# Patient Record
Sex: Female | Born: 1960 | Race: White | Hispanic: No | Smoking: Never smoker
Health system: Southern US, Community
[De-identification: ages and names within clinical notes are randomized; demographics above are authoritative.]

## PROBLEM LIST (undated history)

## (undated) DIAGNOSIS — Z8619 Personal history of other infectious and parasitic diseases: Secondary | ICD-10-CM

## (undated) DIAGNOSIS — T7840XA Allergy, unspecified, initial encounter: Secondary | ICD-10-CM

## (undated) DIAGNOSIS — E785 Hyperlipidemia, unspecified: Secondary | ICD-10-CM

## (undated) DIAGNOSIS — Z860100 Personal history of colon polyps, unspecified: Secondary | ICD-10-CM

## (undated) DIAGNOSIS — F329 Major depressive disorder, single episode, unspecified: Secondary | ICD-10-CM

## (undated) DIAGNOSIS — Z86718 Personal history of other venous thrombosis and embolism: Secondary | ICD-10-CM

## (undated) DIAGNOSIS — I499 Cardiac arrhythmia, unspecified: Secondary | ICD-10-CM

## (undated) DIAGNOSIS — C4491 Basal cell carcinoma of skin, unspecified: Secondary | ICD-10-CM

## (undated) DIAGNOSIS — D689 Coagulation defect, unspecified: Secondary | ICD-10-CM

## (undated) DIAGNOSIS — I10 Essential (primary) hypertension: Secondary | ICD-10-CM

## (undated) DIAGNOSIS — M8580 Other specified disorders of bone density and structure, unspecified site: Secondary | ICD-10-CM

## (undated) DIAGNOSIS — F32A Depression, unspecified: Secondary | ICD-10-CM

## (undated) DIAGNOSIS — G43909 Migraine, unspecified, not intractable, without status migrainosus: Secondary | ICD-10-CM

## (undated) DIAGNOSIS — I252 Old myocardial infarction: Secondary | ICD-10-CM

## (undated) DIAGNOSIS — M1990 Unspecified osteoarthritis, unspecified site: Secondary | ICD-10-CM

## (undated) HISTORY — DX: Allergy, unspecified, initial encounter: T78.40XA

## (undated) HISTORY — DX: Other specified disorders of bone density and structure, unspecified site: M85.80

## (undated) HISTORY — DX: Personal history of colon polyps, unspecified: Z86.0100

## (undated) HISTORY — DX: Hyperlipidemia, unspecified: E78.5

## (undated) HISTORY — DX: Personal history of colonic polyps: Z86.010

## (undated) HISTORY — DX: Essential (primary) hypertension: I10

## (undated) HISTORY — DX: Personal history of other venous thrombosis and embolism: Z86.718

## (undated) HISTORY — DX: Coagulation defect, unspecified: D68.9

## (undated) HISTORY — DX: Unspecified osteoarthritis, unspecified site: M19.90

## (undated) HISTORY — DX: Personal history of other infectious and parasitic diseases: Z86.19

## (undated) HISTORY — PX: DILATION AND CURETTAGE OF UTERUS: SHX78

## (undated) HISTORY — PX: BUNIONECTOMY: SHX129

## (undated) HISTORY — DX: Depression, unspecified: F32.A

## (undated) HISTORY — DX: Basal cell carcinoma of skin, unspecified: C44.91

## (undated) HISTORY — DX: Old myocardial infarction: I25.2

## (undated) HISTORY — PX: NASAL SINUS SURGERY: SHX719

## (undated) HISTORY — DX: Migraine, unspecified, not intractable, without status migrainosus: G43.909

## (undated) HISTORY — DX: Cardiac arrhythmia, unspecified: I49.9

## (undated) HISTORY — DX: Major depressive disorder, single episode, unspecified: F32.9

---

## 1998-05-23 ENCOUNTER — Ambulatory Visit (HOSPITAL_COMMUNITY): Admission: RE | Admit: 1998-05-23 | Discharge: 1998-05-23 | Payer: Self-pay | Admitting: Obstetrics & Gynecology

## 1999-04-26 ENCOUNTER — Other Ambulatory Visit: Admission: RE | Admit: 1999-04-26 | Discharge: 1999-04-26 | Payer: Self-pay | Admitting: Obstetrics & Gynecology

## 2000-05-30 ENCOUNTER — Other Ambulatory Visit: Admission: RE | Admit: 2000-05-30 | Discharge: 2000-05-30 | Payer: Self-pay | Admitting: Obstetrics & Gynecology

## 2001-06-30 ENCOUNTER — Other Ambulatory Visit: Admission: RE | Admit: 2001-06-30 | Discharge: 2001-06-30 | Payer: Self-pay | Admitting: Obstetrics & Gynecology

## 2001-12-02 DIAGNOSIS — I252 Old myocardial infarction: Secondary | ICD-10-CM

## 2001-12-02 HISTORY — DX: Old myocardial infarction: I25.2

## 2002-07-27 ENCOUNTER — Other Ambulatory Visit: Admission: RE | Admit: 2002-07-27 | Discharge: 2002-07-27 | Payer: Self-pay | Admitting: Obstetrics & Gynecology

## 2002-08-29 ENCOUNTER — Encounter: Payer: Self-pay | Admitting: Emergency Medicine

## 2002-08-30 ENCOUNTER — Inpatient Hospital Stay (HOSPITAL_COMMUNITY): Admission: EM | Admit: 2002-08-30 | Discharge: 2002-09-01 | Payer: Self-pay | Admitting: Emergency Medicine

## 2002-08-30 HISTORY — PX: CARDIAC CATHETERIZATION: SHX172

## 2003-08-04 ENCOUNTER — Other Ambulatory Visit: Admission: RE | Admit: 2003-08-04 | Discharge: 2003-08-04 | Payer: Self-pay | Admitting: Obstetrics & Gynecology

## 2003-11-08 ENCOUNTER — Other Ambulatory Visit: Admission: RE | Admit: 2003-11-08 | Discharge: 2003-11-08 | Payer: Self-pay | Admitting: Obstetrics & Gynecology

## 2004-05-16 ENCOUNTER — Other Ambulatory Visit: Admission: RE | Admit: 2004-05-16 | Discharge: 2004-05-16 | Payer: Self-pay | Admitting: Obstetrics & Gynecology

## 2004-10-18 ENCOUNTER — Other Ambulatory Visit: Admission: RE | Admit: 2004-10-18 | Discharge: 2004-10-18 | Payer: Self-pay | Admitting: Obstetrics & Gynecology

## 2005-01-02 HISTORY — PX: FRACTURE SURGERY: SHX138

## 2005-05-07 ENCOUNTER — Other Ambulatory Visit: Admission: RE | Admit: 2005-05-07 | Discharge: 2005-05-07 | Payer: Self-pay | Admitting: Obstetrics & Gynecology

## 2005-11-05 ENCOUNTER — Other Ambulatory Visit: Admission: RE | Admit: 2005-11-05 | Discharge: 2005-11-05 | Payer: Self-pay | Admitting: Obstetrics & Gynecology

## 2008-11-03 ENCOUNTER — Ambulatory Visit: Payer: Self-pay | Admitting: Diagnostic Radiology

## 2008-11-03 ENCOUNTER — Emergency Department (HOSPITAL_BASED_OUTPATIENT_CLINIC_OR_DEPARTMENT_OTHER): Admission: EM | Admit: 2008-11-03 | Discharge: 2008-11-03 | Payer: Self-pay | Admitting: Emergency Medicine

## 2008-12-02 HISTORY — PX: ROTATOR CUFF REPAIR: SHX139

## 2010-02-09 LAB — HM COLONOSCOPY

## 2010-07-04 ENCOUNTER — Ambulatory Visit: Payer: Self-pay | Admitting: Cardiology

## 2011-01-01 LAB — HM DEXA SCAN

## 2011-01-01 LAB — HM MAMMOGRAPHY: HM Mammogram: NORMAL

## 2011-01-01 LAB — HM PAP SMEAR: HM Pap smear: NORMAL

## 2011-01-07 ENCOUNTER — Ambulatory Visit (INDEPENDENT_AMBULATORY_CARE_PROVIDER_SITE_OTHER): Payer: Self-pay | Admitting: Cardiology

## 2011-01-07 DIAGNOSIS — E7800 Pure hypercholesterolemia, unspecified: Secondary | ICD-10-CM

## 2011-04-19 NOTE — Consult Note (Signed)
NAME:  Lindsay [name], Lindsay [name]                       ACCOUNT NO.:  [id number]   MEDICAL RECORD NO.:  [id number]                   PATIENT TYPE:  INP   LOCATION:  2001                                 FACILITY:  [facility]   PHYSICIAN:  Aundra Dubin, M.D.            DATE OF BIRTH:  [birth year]   DATE OF CONSULTATION:  08/31/2002  DATE OF DISCHARGE:                                   CONSULTATION   CHIEF COMPLAINT:  Suspected myocardial infarction.   HISTORY OF PRESENT ILLNESS:  The patient is a 50 year old white female who  has no history of DVT or PE who had an acute onset of chest pain beginning  early on 08/30/2002.  This was associated with left arm numbness and nausea  but no vomiting.  She had palpitations, and her heart was racing.  She spoke  with a nurse through her insurance company who advised her to call 911.  As  the EMS arrived, she was found to have an irregular heart beat.  Her initial  CK total was 202 with a CK-MB 18.2.  Her troponin was 2.0 (0-0.04).  The CK  total increased over time to 744 with an MB fraction 89.  The troponin rose  to 12.84.  She has a normal homocystine level.  Her AST on admission was 130  (0-37), and her ALT was 19.  Glucose was 118, creatinine 0.8, calcium 8.8.  WBC 6.7, hemoglobin 12.7, platelets 266.  Two weeks prior to this episode of  chest pain, she had been switched to a continuous estrogen patch to help  with menstrual problems.  She is a nonsmoker.   When she was given nitroglycerin, she had an immediate reduction in her  chest pain and tightness.   REVIEW OF SYSTEMS:  There has been no fever, rashes, or weight loss.  She  has an ashiness to her knees, but this is a long-term problem.  There has  been no swollen joints.  Her energy level is good.  She generally sleeps  well.  There has been no diarrhea or constipation, blood or mucus in bowel  movement.  She denies back pain.   PAST MEDICAL/SURGICAL HISTORY:  In 1988, she had a  miscarriage at 2 months.  She has had irregular menses for many years.  She was off of oral  contraceptives during the 1990s and began taking them again in early 2000.  She has known osteopenia from a DEXA in 08/2000, and her 07/2002 DEXA showed  some worsening to this.  She has had two D&C's, deviated septal repair, and  wisdom teeth extraction.   MEDICATIONS PRIOR TO ADMISSION:  1. Ortho Evra.  2. Rhinocort.  3. Multivitamins.  4. Tums.   MEDICATIONS IN HOSPITAL:  1. Metoprolol 12.5 mg b.i.d.  2. Potassium 20 mEq q.d.  3. Diazepam p.r.n.  4. Aspirin 81 mg q.d.   ALLERGIES:  No known drug intolerances.  FAMILY HISTORY:  Her mother died at age 74 with a cardiomyopathy felt to be  related to possible scarlet fever.  She was awaiting a transplant.  Her  father is 74 years of age and has had a CABG.   SOCIAL HISTORY:  She is a [place] native.  She is presently divorced and  lives with a 57 year old daughter.  She works for [employer].  She completed three  years of college in accounting in business.  She has occasional social glass  of wine and does not smoke.   PHYSICAL EXAMINATION:  VITAL SIGNS:  Blood pressure 110/76, temperature  98.8, respirations 14.  GENERAL:  Healthy-appearing woman.  SKIN:  Clear.  There is no nail fold dilatation or signs of livido  reticularis.  HEENT  The left pupil is slightly larger than the right.  EOMI.  Mouth  clear.  NECK:  Slight cervical adenopathy, normal thyroid.  LUNGS:  Clear.  HEART:  Regular, no murmur.  ABDOMEN:  Negative hepatosplenomegaly, nontender.  MUSCULOSKELETAL:  Hands, wrists,elbows, shoulders, neck, back, hips, knees,  ankles, and feet all have a full range of motion.  No arthritis.  NEUROLOGIC:  Nonfocal.   ASSESSMENT/PLAN:  Suspected coronary syndrome.  She has undergone a  catheterization which shows normal coronary anatomy along with normal left  ventricular function.  With the very high CK-MB and troponin, it does  appear  that she had some type of cardiac event but fortunately has not greatly  suffered cardiac damage.  Electrocardiograms throughout most of the  hospitalization were normal.   The differential certainly includes antiphospholipid antibody syndrome.  Also, she will need a full workup for anticoagulation tendencies. We will  check labs mostly as she gets out of the hospital.  We will check a  prothrombin G mutation while still here.  Want her to be off of heparin for  several more days so it does not interfere with several of the tests.  I did  not find signs that she had vasculitis.  She did not seem weak to correspond  with a myositis.                                                Aundra Dubin, M.D.    WWT/MEDQ  D:  08/31/2002  T:  09/03/2002  Job:  [id number]   cc:   Thomas A. Patty Sermons, M.D.   Oley Balm Georgina Pillion, M.D.  [address]  [place]  [place] [address]  Fax: [phone]

## 2011-04-19 NOTE — H&P (Signed)
NAME:  Lindsay [name], Lindsay [name]                         ACCOUNT NO.:  [id number]   MEDICAL RECORD NO.:  [id number]                   PATIENT TYPE:   LOCATION:                                       FACILITY:   PHYSICIAN:  Thomas A. Patty Sermons, M.D.           DATE OF BIRTH:   DATE OF ADMISSION:  08/30/2002  DATE OF DISCHARGE:                                HISTORY & PHYSICAL   CHIEF COMPLAINT:  Chest pain.   HISTORY OF PRESENT ILLNESS:  This is a 50 year old divorced Caucasian female  admitted through the emergency room with left chest pain and left arm  radiation. The patient has no prior history of known heart problems.  Tonight, she was lying on the couch watching television with her 3 year old  daughter and noted the onset of chest discomfort. She was slightly  nauseated. She called the Help Line with her insurance company and talked to  the nurse who recommended that she call 911 and get to the hospital, which  she did. There was no vomiting but she was slightly queasy but there was no  diaphoresis. The patient has no prior history of heart problems. She has  never had an EKG prior to tonight. She is not diabetic and as far as she  knows, her cholesterol's have been good in the past. She is a non-smoker.  She does not exercise on a regular basis but has used the treadmill at [place]  [place] from time to time and has never had any  difficulty using it.   FAMILY HISTORY:  Her mother died at 36 years of age of  cardiomyopathy while  awaiting heart transplant at [facility]. Father is living at age 73 years and had  coronary artery bypass grafting four or five years ago. She has a brother  age 51 years with high blood pressure.   SOCIAL HISTORY:  She is divorced. She has a 85 year old daughter. She is a  Civil Service fast streamer for [employer]. She is a non-smoker. She drinks occasional  alcohol but not on a regular basis.   ALLERGIES:  SULFA AND AMOXICILLIN.   MEDICATIONS:  1.  Rhinocort spray, one spray in each nostril at bedtime.  2. Orthoptera patch once a week for osteopenia.  3. Multivitamins daily.  4. Tums.   PAST SURGICAL HISTORY:  She has had two D&C's in the past. She has also has  sinus surgery by Dr. Anner Crete approximately ten years ago.   REVIEW OF SYSTEMS:  She notes occasional diarrhea. Genitourinary history  reveals no dysuria. She has had no menses since being on the Orthoptera  patch from Dr. Lloyd Huger.   PHYSICAL EXAMINATION:  VITAL SIGNS: Blood pressure 129/70, pulse 88 and  regular, respiratory rate normal.  GENERAL: A healthy appearing white female in no distress. The pain is gone  now.  HEENT: Unremarkable. Pupils are equal, round, and reactive to light and  accommodation. Sclera clear.  Mouth and pharynx normal. Jugular venous  pressure normal. Carotids normal. Thyroid normal. No lymphadenopathy.  CHEST: Clear.  HEART: No murmur, rub, or gallop. No click.  ABDOMEN: Soft and nontender. No hepatosplenomegaly. No masses.  EXTREMITIES: Good peripheral pulses. No edema or phlebitis.  NEURO: Examination is physiologic.   LABORATORY DATA:  Studies are abnormal. She has an elevated CK MB of 18,  total CK of 202. Her troponin is elevated at 2.0. Potassium is 3.5.   DIAGNOSTIC STUDIES:  Chest x-ray is normal with clear lungs. EKG shows  accelerated ventricular rhythm with a right bundle branch block  configuration. Repeat EKG shows normal sinus rhythm and no ischemic changes.   IMPRESSION:  Acute coronary syndrome with chest pain and positive enzymes  and transient accelerated ventricular rhythm.   DISPOSITION:  We are admitting for IV Nitroglycerin, IV Heparin. We are  starting her on beta blockers. Anticipate cardiac catheterization on Monday  afternoon, August 30, 2002 if catheterization schedule allows. The  patient will also be treated with aspirin. Will screen her for risk factors  including dyslipidemia, CIP, and homocystine  levels.                                               Thomas A. Patty Sermons, M.D.    TAB/MEDQ  D:  08/30/2002  T:  08/30/2002  Job:  [id number]   cc:   Oley Balm. Georgina Pillion, M.D.  [address]  [place]  [place] [address]  Fax: [phone]   Abbey Chatters, M. D.

## 2011-04-19 NOTE — Discharge Summary (Signed)
NAME:  Lindsay [name], Lindsay [name]                       ACCOUNT NO.:  [id number]   MEDICAL RECORD NO.:  [id number]                   PATIENT TYPE:  INP   LOCATION:  2001                                 FACILITY:  [facility]   PHYSICIAN:  Cassell Clement, MD                DATE OF BIRTH:  01/31/1961   DATE OF ADMISSION:  08/29/2002  DATE OF DISCHARGE:  09/01/2002                                 DISCHARGE SUMMARY   FINAL DIAGNOSES:  1. Acute coronary syndrome with positive cardiac enzymes and transient     accelerated ventricular rhythm.  2. Possible hypercoagulable state.   PROCEDURES:  Left heart catheterization by Dr. Peter Swaziland on 08/30/2002.   HISTORY OF PRESENT ILLNESS:  This is a 50 year old divorced Caucasian female  admitted through the emergency room with left-sided chest pain with left arm  radiation.  She had no prior history of known heart problems.  On the  evening of admission, she was lying on the couch watching television with  her 52 year old daughter and noted onset of chest discomfort. She was  slightly nauseated.  She called the help line that her insurance company  provides and talked to a nurse who recommended that she call 911 and get to  the hospital which she did.  There was no vomiting, but she was slightly  queasy.  There was no diaphoresis. She has never had a previous EKG prior to  tonight.  She does not have diabetes or hypercholesterolemia, and she is a  nonsmoker.  She has no history of exertional chest pain.   FAMILY HISTORY:  It is noted that her mother died at age 22 of a  cardiomyopathy while awaiting heart transplant at [facility].  Her father is  living at age 37 and has had coronary artery bypass graft.  She has a  daughter, age 34, with high blood pressure.   SOCIAL HISTORY:  She is divorced, has a 40 year old daughter.  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   MEDICATIONS:  She has been on Ortho Evra patches for osteopenia.   PHYSICAL  EXAMINATION:  VITAL SIGNS:  Blood pressure 129/70, pulse 88 and  regular.  Respirations are normal.  HEENT:  Negative.  Mouth and pharynx normal.  NECK:  Jugular venous pressure normal, carotids normal, thyroid normal.  No  lymphadenopathy.  CHEST: Clear.  HEART:  No murmur, gallop, or rub.  ABDOMEN:  Soft and nontender.  There is no hepatosplenomegaly. There is no  mass.  EXTREMITIES:  No phlebitis or edema.  NEUROLOGIC:  Physiologic.   LABORATORY DATA:  Initial lab studies showed a CK-MB of 18 and a troponin at  2.0, both of which are elevated.  Serum potassium was 3.5 on admission.   Electrocardiogram shows transient ventricular rhythm with a right bundle  branch block and fibrillation.  A repeat EKG shows normal sinus rhythm and  no  ischemic changes.   Chest x-ray is negative.   HOSPITAL COURSE:  Initial diagnosis was acute coronary syndrome with chest  pain, positive enzymes, and transient ventricular rhythm.  She was treated  with IV nitroglycerin and IV heparin, and she was started on beta blockers.  She was seen by Dr. Peter Swaziland who performed cardiac catheterization on  the afternoon of admission.  The catheterization showed normal coronary  anatomy, no spasm, no filling defects, and she had a normal left ventricular  ejection fraction of 705.  It was felt that the patient had myocardial image  by enzymes with normal coronary anatomy, raising a question of a  hypercoagulable state.  She was taken off her Ortho Evra hormone patches.  We asked Dr. Stacey Drain to see the patient for possible vasculitis or  myocarditis evaluation.  Dr. Kellie Simmering felt that she needed a workup for  anticoagulation tendency.  He noted that she had a normal homocystine level  and a mid-range C-reactive protein.  He checked her for a [address] prothrombin  gene mutation and also recommended that we check antinuclear antibody, lupus  anticoagulant, and Leiden factor V, anti-AT3, protein C, and  protein S.  He  recommended continuing her on aspirin at discharge and to avoid estrogen-  containing medication.  He did not feel that she had lupus.   By 09/01/2002, the patient was doing well.  Sed rate was normal, and she was  felt to be ready for discharge to be followed on an outpatient basis.   ACCESSORY LABORATORY DATA:  CBC normal.  Sed rate 13.  PT and PTT were  normal.  Electrolytes were normal.  Albumin was 3.2.  SGOT was elevated at  130.  Homocystine level was 9.76 with the normal being between 5 and 13.  Her CKs were 202, 915, 747, 319, and 159 with CK-MBs of 18, 126, 89, 24, and  6.  Likewise troponins were positive at 2, 12, 8, 6, and 3.  Lipid panel  showed total cholesterol of 170, triglycerides 100, HDL cholesterol 58, LDL  cholesterol 92, all of which were within the desirable range.  T4 and TSH  were normal.  High sensitivity C-reactive protein was 2.67.  Antinuclear  antibody was negative.  Prothrombin gene mutation was negative.   The patient was discharged improved.   DISCHARGE MEDICATIONS:  1. Lopressor 50 mg 1/2 tablet twice a day.  2. Rhinocort spray at bedtime.  3. Ecotrin 81 mg daily.  4. Imdur 30 mg daily.  5. K-Dur 10 mEq daily.  6. Nitrostat 1/150 sublingually p.r.n.   ACTIVITY:  She is to take short walks and restart driving 60/03/5408.   FOLLOW UP:  She will be seen in the office on 09/06/2002, and she is to call  for an appointment for office visit, EKG, and BNP.  She will also follow up  with Dr. Kellie Simmering in his office.   CONDITION ON DISCHARGE:  Improved.                                                Cassell Clement, MD    TB/MEDQ  D:  09/17/2002  T:  09/19/2002  Job:  [id number]   cc:   Aundra Dubin, M.D.  [address]  [place]  [place] [address]  Fax: [phone]   Oley Balm. Georgina Pillion, M.D.  [address]  [place]  [place] [address]  Fax: [phone]   W. Varney Baas, M.D. [address] [place]  [place] [address]  Fax:  [phone]

## 2011-04-19 NOTE — Cardiovascular Report (Signed)
   NAME:  Lindsay [name], Lindsay [name]                       ACCOUNT NO.:  [id number]   MEDICAL RECORD NO.:  [id number]                   PATIENT TYPE:  INP   LOCATION:  2001                                 FACILITY:  [facility]   PHYSICIAN:  Peter M. Swaziland, M.D.               DATE OF BIRTH:  11/07/1961   DATE OF PROCEDURE:  08/30/2002  DATE OF DISCHARGE:                              CARDIAC CATHETERIZATION   INDICATIONS FOR PROCEDURE:  The patient is a 50 year old white female who  presented with symptoms of chest pain and had abnormal cardiac enzyme  elevation. ECG was normal. The patient has been on birth control pills and  has family history of heart disease but no other risk factors.   ACCESS:  Via the right femoral artery using the standard Seldinger  technique.   EQUIPMENT:  The 6 French 4 cm right and left Judkins catheter, 6 French  pigtail catheter, 6 French arterial sheath.   MEDICATIONS:  Local anesthesia with 1% Xylocaine.   CONTRAST:  Omnipaque 120 cc.   HEMODYNAMIC DATA:  Aortic pressure is 105/68 with a mean of 86.  Left  ventricular  pressure is 111 with an EDP of 12 mmHg.   ANGIOGRAPHIC DATA:  The left coronary artery arises and distributes  normally.   The left main coronary artery is normal.   The left anterior descending artery and its branches are normal.   The left circumflex coronary artery and its branches are normal.   The right coronary artery arises and distributes normally.  It is a dominant  vessel. It appears normal.   LEFT VENTRICULAR ANGIOGRAPHY:  The left ventricular angiography was  performed in the RAO and LAO cranial views.  This demonstrates normal left  ventricular size and contractility with normal systolic function.  Ejection  fraction is estimated at 70%.  There is no mitral regurgitation or prolapse.  The aortic root is normal in size.    FINAL INTERPRETATION:  1. Normal coronary anatomy.  2. Normal left ventricular  function.                                                   Peter M. Swaziland, M.D.    PMJ/MEDQ  D:  08/30/2002  T:  09/02/2002  Job:  [id number]   cc:   Thomas A. Patty Sermons, M.D.

## 2011-05-01 ENCOUNTER — Telehealth: Payer: Self-pay | Admitting: Cardiology

## 2011-05-01 NOTE — Telephone Encounter (Signed)
Nail bed on ring and pink worse on left hand other fingers are starting to get it now, some pain in left shoulder. Onycholysis is what the dermatoligst. Could it be circulator problem

## 2011-05-01 NOTE — Telephone Encounter (Signed)
Miss Barnard described her problem with her nailbeds of her ring and pinky finger being white and that fungal test by dermatologist negative. She has no swelling of the hand. She report tender ball of her hand near the "lifeline" Reports left shoulder aches. Has a history of bursitis.in left shoulder and had a rotator cuff repair on that shoulder 2 years ago. Discussed pt complaints with Dr Patty Sermons. He does not feel it is circulatory related and suggests that she follow with orthopedics. Pt informed of such and verbalizes understanding.

## 2011-06-21 ENCOUNTER — Telehealth: Payer: Self-pay | Admitting: Cardiology

## 2011-06-21 NOTE — Telephone Encounter (Signed)
Sent fax to [employer] as requested

## 2011-06-21 NOTE — Telephone Encounter (Signed)
Pt called said she would like an order faxed to her employer [employer] [facility] fax number [phone] so she can get her lipid and cmet done there please call if you have questions

## 2011-07-08 ENCOUNTER — Encounter: Payer: Self-pay | Admitting: *Deleted

## 2011-07-08 ENCOUNTER — Ambulatory Visit (INDEPENDENT_AMBULATORY_CARE_PROVIDER_SITE_OTHER): Payer: BC Managed Care – PPO | Admitting: Cardiology

## 2011-07-08 ENCOUNTER — Encounter: Payer: Self-pay | Admitting: Cardiology

## 2011-07-08 VITALS — BP 110/78 | HR 68 | Wt 134.0 lb

## 2011-07-08 DIAGNOSIS — E7800 Pure hypercholesterolemia, unspecified: Secondary | ICD-10-CM

## 2011-07-08 DIAGNOSIS — I214 Non-ST elevation (NSTEMI) myocardial infarction: Secondary | ICD-10-CM

## 2011-07-08 DIAGNOSIS — I219 Acute myocardial infarction, unspecified: Secondary | ICD-10-CM | POA: Insufficient documentation

## 2011-07-08 NOTE — Assessment & Plan Note (Signed)
The patient has a history of borderline hypercholesterolemia.  She had recent lab work done as part of a company physical which showed that her LDL was 109.  The remainder of the labs are satisfactory.  She is not on any statin therapy.  She tries to watch her diet in terms of low cholesterol foods and also tries to exercise regularly aerobically.

## 2011-07-08 NOTE — Progress Notes (Signed)
AMILEY SHISHIDO Date of Birth:  04-06-1961 [facility] [address]   [address] [place], [place]  [address] [phone]           Fax   [phone]  HPI: This pleasant 50 year old married Caucasian woman is seen for a six-month followup office visit.  She has a history of hypercholesterolemia and she has a past history of having had an acute myocardial infarction at a time that she was on birth control pills.  She was 50 years old at the time and presented with chest pain and abnormal cardiac enzyme elevation with normal EKG.  Her cardiac catheterization on 09/29/3 showed normal coronary arteries and normal left ventricular function with an ejection fraction of 70%.  He felt at the time that her acute myocardial infarction was secondary to birth control pills.  She is prone to blood clots because of an unusual prothrombin factor and has been evaluated at [facility].  Since that time she has done well.  She's had no recurrent chest pain.  She had a normal nuclear stress test 07/30/08.  His is regularly at the gym.  Recently she's had some problems with perimenopausal symptoms but has found a new herbal remedy which has helped her along with taking vitamin E daily.  Current Outpatient Prescriptions  Medication Sig Dispense Refill  . Biotin 5000 MCG TABS Take by mouth.        . cetirizine (ZYRTEC) 10 MG tablet Take 10 mg by mouth daily.        . Cholecalciferol (VITAMIN D3) 2000 UNITS TABS Take by mouth.        . Multiple Vitamin (MULTIVITAMIN) tablet Take 1 tablet by mouth daily.        . verapamil (CALAN-SR) 240 MG CR tablet Take 240 mg by mouth at bedtime.        . vitamin C (ASCORBIC ACID) 500 MG tablet Take 500 mg by mouth daily.        . vitamin E 400 UNIT capsule Take 400 Units by mouth daily.          Allergies  Allergen Reactions  . Penicillins   . Sulfa Antibiotics     There is no problem list on file for this patient.   History  Smoking status  .  Never Smoker   Smokeless tobacco  . Not on file    History  Alcohol Use: Not on file    No family history on file.  Review of Systems: The patient denies any heat or cold intolerance.  No weight gain or weight loss.  The patient denies headaches or blurry vision.  There is no cough or sputum production.  The patient denies dizziness.  There is no hematuria or hematochezia.  The patient denies any muscle aches or arthritis.  The patient denies any rash.  The patient denies frequent falling or instability.  There is no history of depression or anxiety.  All other systems were reviewed and are negative.   Physical Exam: Filed Vitals:   07/08/11 0913  BP: 110/78  Pulse: 68  The general appearance feels a well-developed well-nourished woman in no distress.The head and neck exam reveals pupils equal and reactive.  Extraocular movements are full.  There is no scleral icterus.  The mouth and pharynx are normal.  The neck is supple.  The carotids reveal no bruits.  The jugular venous pressure is normal.  The  thyroid is not enlarged.  There is  no lymphadenopathy.  The chest is clear to percussion and auscultation.  There are no rales or rhonchi.  Expansion of the chest is symmetrical.  The precordium is quiet.  The first heart sound is normal.  The second heart sound is physiologically split.  There is no murmur gallop rub or click.  There is no abnormal lift or heave.  The abdomen is soft and nontender.  The bowel sounds are normal.  The liver and spleen are not enlarged.  There are no abdominal masses.  There are no abdominal bruits.  Extremities reveal good pedal pulses.  There is no phlebitis or edema.  There is no cyanosis or clubbing.  Strength is normal and symmetrical in all extremities.  There is no lateralizing weakness.  There are no sensory deficits.  The skin is warm and dry.  There is no rash.      Assessment / Plan: Continue prudent diet.  Continue same medication.  Recheck in 6  months for office visit and fasting lab work.

## 2011-08-02 ENCOUNTER — Ambulatory Visit (INDEPENDENT_AMBULATORY_CARE_PROVIDER_SITE_OTHER): Payer: BC Managed Care – PPO | Admitting: Family

## 2011-08-02 ENCOUNTER — Encounter: Payer: Self-pay | Admitting: Family

## 2011-08-02 DIAGNOSIS — M1990 Unspecified osteoarthritis, unspecified site: Secondary | ICD-10-CM

## 2011-08-02 DIAGNOSIS — Z0000 Encounter for general adult medical examination without abnormal findings: Secondary | ICD-10-CM

## 2011-08-02 DIAGNOSIS — D689 Coagulation defect, unspecified: Secondary | ICD-10-CM

## 2011-08-02 DIAGNOSIS — F329 Major depressive disorder, single episode, unspecified: Secondary | ICD-10-CM

## 2011-08-02 DIAGNOSIS — R51 Headache: Secondary | ICD-10-CM

## 2011-08-02 DIAGNOSIS — Z23 Encounter for immunization: Secondary | ICD-10-CM

## 2011-08-02 DIAGNOSIS — I252 Old myocardial infarction: Secondary | ICD-10-CM | POA: Insufficient documentation

## 2011-08-02 LAB — CBC WITH DIFFERENTIAL/PLATELET
Basophils Absolute: 0.1 10*3/uL (ref 0.0–0.1)
Eosinophils Absolute: 0.2 10*3/uL (ref 0.0–0.7)
Eosinophils Relative: 4 % (ref 0–5)
Lymphocytes Relative: 45 % (ref 12–46)
Lymphs Abs: 2.2 10*3/uL (ref 0.7–4.0)
MCV: 95.8 fL (ref 78.0–100.0)
Neutrophils Relative %: 43 % (ref 43–77)
Platelets: 318 10*3/uL (ref 150–400)
RBC: 4.56 MIL/uL (ref 3.87–5.11)
RDW: 13.7 % (ref 11.5–15.5)
WBC: 4.8 10*3/uL (ref 4.0–10.5)

## 2011-08-02 LAB — TSH: TSH: 1.451 u[IU]/mL (ref 0.350–4.500)

## 2011-08-02 NOTE — Progress Notes (Signed)
Subjective:    Patient ID: Lindsay [name], female    DOB: 01/26/1961, 50 y.o.   MRN: [id number]  HPI  Lindsay [name] is a 50 yr old female who presents today to establish care.  She is requesting a physical today.  Pap smear 12/2010- normal.  Last mammogram 12/2010- normal.  She is followed by GYN.  Last tetanus was in 1997.  Colo- March 2011- polyp Due for follow up- [facility],   Preventative- Reports that she exercises regularly and eats a healthy diet.    Arthritis- neck only.   Depression-  She only had when she was going through divorce- 16. She was treated with medication briefly.    Headaches- As a teenager - was told that she had migraines.  Reports at this time her headaches are infrequent and relieved by ibuprofen.  Reports one pupil larger than the other- chronic.   History of elevated blood pressure due to birth control.   CAD- she was placed on orthoevra patch, she had an acute MI.  "heart was throwing off blood clots."  She +prothrombin factor.  She reports that she has been on aspirin in the past but that it was discontinued by Dr. Patty Sermons due to easy bruising.    Review of Systems  Constitutional: Negative for unexpected weight change.  HENT: Negative for hearing loss.   Eyes:       Nearsighted, wears contact lenses  Respiratory: Negative for chest tightness and shortness of breath.   Cardiovascular: Negative for chest pain and leg swelling.  Gastrointestinal: Negative for nausea, vomiting and diarrhea.  Genitourinary: Negative for dysuria and urgency.       Perimenopausal- light period  Musculoskeletal: Negative for back pain.       Notes some chronic neck pain and bunion pain.    Skin: Negative for rash.       Notes that she often develops "yeast between her breasts"  Neurological: Negative for headaches.  Hematological: Bruises/bleeds easily.  Psychiatric/Behavioral:       Depression is well controlled.    Past Medical History  Diagnosis Date  . Hyperlipidemia    . Acute myocardial infarction     normal coronary arteries  . Hypertension   . Arthritis   . History of chicken pox   . Depression   . Allergy   . Migraines   . History of blood clots     Thought to be due to Ortho Evra patch  . History of heart attack 2003    thought to be due to hormone replacement therapy  . Arrhythmia   . History of colon polyps   . Osteopenia   . Basal cell carcinoma   . Clotting disorder 08/06/2011    History   Social History  . Marital Status: Legally Separated    Spouse Name: N/A    Number of Children: 1  . Years of Education: N/A   Occupational History  . Not on file.   Social History Main Topics  . Smoking status: Never Smoker   . Smokeless tobacco: Never Used  . Alcohol Use: 1.0 - 1.5 oz/week    2-3 drink(s) per week  . Drug Use: No  . Sexually Active: Not on file   Other Topics Concern  . Not on file   Social History Narrative   Regular exercise:  Yes. At least 2 x weeklyCaffeine Use:  2 cups coffee daily.Bank operations- BBTMarried- she has a 73 year old daughter (lives with her father)  Past Surgical History  Procedure Date  . Nasal sinus surgery     Dr. Anner Crete  . Cardiac catheterization 08/30/2002    Dr. [place]  . Dilation and curettage of uterus 1978, 1998  . Bunionectomy 2008  . Rotator cuff repair 2010  . Fracture surgery 01/2005    left arm    Family History  Problem Relation Age of Onset  . Cardiomyopathy Mother   . Stroke Mother   . Hypertension Mother   . Hyperlipidemia Father   . Heart disease Father   . Arthritis Paternal Grandmother   . Kidney disease Paternal Grandmother     Allergies  Allergen Reactions  . Penicillins   . Sulfa Antibiotics     Current Outpatient Prescriptions on File Prior to Visit  Medication Sig Dispense Refill  . Biotin 5000 MCG TABS Take by mouth.        . cetirizine (ZYRTEC) 10 MG tablet Take 10 mg by mouth daily.        . Cholecalciferol (VITAMIN D3) 2000 UNITS TABS Take  by mouth.        . Multiple Vitamin (MULTIVITAMIN) tablet Take 1 tablet by mouth daily.        . verapamil (CALAN-SR) 240 MG CR tablet Take 240 mg by mouth at bedtime.        . vitamin C (ASCORBIC ACID) 500 MG tablet Take 500 mg by mouth daily.        . vitamin E 400 UNIT capsule Take 400 Units by mouth daily.          BP 96/70  Pulse 78  Temp(Src) 97.8 F (36.6 C) (Oral)  Resp 16  Ht 5\' 5"  (1.651 m)  Wt 135 lb (61.236 kg)  BMI 22.47 kg/m2  LMP 07/22/2011        Objective:   Physical Exam  Constitutional: She appears well-developed and well-nourished.  HENT:  Head: Normocephalic and atraumatic.  Mouth/Throat: Uvula is midline, oropharynx is clear and moist and mucous membranes are normal.  Eyes: Conjunctivae are normal. Pupils are equal, round, and reactive to light. Right eye exhibits no discharge. Left eye exhibits no discharge. No scleral icterus.  Neck: Normal range of motion. Neck supple. No thyromegaly present.  Cardiovascular: Normal rate and regular rhythm.   No murmur heard. Pulmonary/Chest: Effort normal and breath sounds normal. No respiratory distress. She has no wheezes. She has no rales. She exhibits no tenderness.  Abdominal: Soft. Bowel sounds are normal.  Genitourinary:       Breast/pelvic exam deferred to GYN  Musculoskeletal: Normal range of motion. She exhibits no edema.  Skin: Skin is warm and dry.  Psychiatric: She has a normal mood and affect. Her behavior is normal. Judgment and thought content normal.          Assessment & Plan:

## 2011-08-02 NOTE — Patient Instructions (Addendum)
Please complete your blood work on the first floor and forward [place] a copy of your most recent blood work done with Dr. Patty Sermons.  We will mail you results to you. Follow up in 1 year, sooner if problems or concerns. Keep up the good work with your healthy diet/exercise.   Welcome to [facility]!

## 2011-08-05 ENCOUNTER — Encounter: Payer: Self-pay | Admitting: Family

## 2011-08-06 ENCOUNTER — Encounter: Payer: Self-pay | Admitting: Family

## 2011-08-06 ENCOUNTER — Telehealth: Payer: Self-pay | Admitting: Family

## 2011-08-06 DIAGNOSIS — R519 Headache, unspecified: Secondary | ICD-10-CM | POA: Insufficient documentation

## 2011-08-06 DIAGNOSIS — F329 Major depressive disorder, single episode, unspecified: Secondary | ICD-10-CM | POA: Insufficient documentation

## 2011-08-06 DIAGNOSIS — M1990 Unspecified osteoarthritis, unspecified site: Secondary | ICD-10-CM | POA: Insufficient documentation

## 2011-08-06 DIAGNOSIS — D689 Coagulation defect, unspecified: Secondary | ICD-10-CM

## 2011-08-06 DIAGNOSIS — R51 Headache: Secondary | ICD-10-CM | POA: Insufficient documentation

## 2011-08-06 DIAGNOSIS — Z0000 Encounter for general adult medical examination without abnormal findings: Secondary | ICD-10-CM | POA: Insufficient documentation

## 2011-08-06 HISTORY — DX: Unspecified osteoarthritis, unspecified site: M19.90

## 2011-08-06 HISTORY — DX: Coagulation defect, unspecified: D68.9

## 2011-08-06 NOTE — Assessment & Plan Note (Signed)
50 yr old female with history of "abnormal prothrombin factor." Had history of MI back in 2003 which was in the setting of birth control use.  She has been intolerant to aspirin due to abnormal bruising.

## 2011-08-06 NOTE — Assessment & Plan Note (Addendum)
Commended pt on her healthy diet and regular exercise. She is instructed to schedule her follow up colo with [facility].  Tdap given today.  She declines flu shot. Mammo/Pap up to date and followed by GYN.

## 2011-08-06 NOTE — Assessment & Plan Note (Signed)
Reports that this is in her neck and is currently stable. Monitor.

## 2011-08-06 NOTE — Assessment & Plan Note (Signed)
Reports that headaches are infrequent and are relieved by PRN motrin.

## 2011-08-06 NOTE — Telephone Encounter (Signed)
Spoke with pt re: referral to hematology. She is agreeable.

## 2011-08-06 NOTE — Assessment & Plan Note (Signed)
Reports that this is stable and was largely situation in the setting of her divorce.

## 2011-08-13 ENCOUNTER — Ambulatory Visit: Payer: BC Managed Care – PPO | Admitting: Hematology & Oncology

## 2011-08-29 ENCOUNTER — Telehealth: Payer: Self-pay | Admitting: *Deleted

## 2011-08-29 NOTE — Telephone Encounter (Signed)
Received records from [facility] and forwarded to Provider for review.

## 2011-09-06 LAB — POCT CARDIAC MARKERS: CKMB, poc: <1 ng/mL — ABNORMAL LOW (ref 1.0–8.0)

## 2011-10-02 ENCOUNTER — Other Ambulatory Visit: Payer: Self-pay | Admitting: Hematology & Oncology

## 2011-10-02 ENCOUNTER — Ambulatory Visit (HOSPITAL_BASED_OUTPATIENT_CLINIC_OR_DEPARTMENT_OTHER): Payer: BC Managed Care – PPO | Admitting: Hematology & Oncology

## 2011-10-02 DIAGNOSIS — I252 Old myocardial infarction: Secondary | ICD-10-CM

## 2011-10-02 DIAGNOSIS — R791 Abnormal coagulation profile: Secondary | ICD-10-CM

## 2011-10-02 LAB — CBC WITH DIFFERENTIAL (CANCER CENTER ONLY)
BASO#: 0.1 10*3/uL (ref 0.0–0.2)
BASO%: 1.5 % (ref 0.0–2.0)
EOS%: 4.5 % (ref 0.0–7.0)
HCT: 40.4 % (ref 34.8–46.6)
HGB: 13.8 g/dL (ref 11.6–15.9)
MCH: 32 pg (ref 26.0–34.0)
MCHC: 34.2 g/dL (ref 32.0–36.0)
MONO%: 6.5 % (ref 0.0–13.0)
NEUT%: 46.2 % (ref 39.6–80.0)
RDW: 12.7 % (ref 11.1–15.7)

## 2011-10-09 LAB — HYPERCOAGULABLE PANEL, COMPREHENSIVE
AntiThromb III Func: 125 % (ref 76–126)
Anticardiolipin IgG: 8 GPL U/mL (ref <23)
Beta-2 Glyco I IgG: 1 G Units (ref <20)
Beta-2-Glycoprotein I IgA: 2 A Units (ref <20)
Beta-2-Glycoprotein I IgM: 8 M Units (ref <20)
Lupus Anticoagulant: NOT DETECTED
Protein C, Total: 129 % (ref 72–160)
Protein S Total: 117 % (ref 60–150)

## 2011-10-09 LAB — IRON AND TIBC: UIBC: 238 ug/dL (ref 125–400)

## 2011-10-09 LAB — MTHFR DNA ANALYSIS

## 2011-12-29 ENCOUNTER — Other Ambulatory Visit: Payer: Self-pay | Admitting: Cardiology

## 2012-01-06 ENCOUNTER — Encounter: Payer: Self-pay | Admitting: Cardiology

## 2012-01-07 ENCOUNTER — Ambulatory Visit (INDEPENDENT_AMBULATORY_CARE_PROVIDER_SITE_OTHER): Payer: BC Managed Care – PPO | Admitting: Cardiology

## 2012-01-07 ENCOUNTER — Encounter: Payer: Self-pay | Admitting: Cardiology

## 2012-01-07 VITALS — BP 98/64 | HR 72 | Resp 18 | Ht 64.0 in | Wt 138.0 lb

## 2012-01-07 DIAGNOSIS — D689 Coagulation defect, unspecified: Secondary | ICD-10-CM

## 2012-01-07 DIAGNOSIS — E7800 Pure hypercholesterolemia, unspecified: Secondary | ICD-10-CM

## 2012-01-07 NOTE — Progress Notes (Signed)
Lindsay [name] Date of Birth:  Nov 12, 1961 [facility] [address] [place], [place]  [address] [phone]         Fax   [phone]  History of Present Illness: This pleasant 51 year old woman is seen for a six-month followup office visit.  She has a past history of hypercholesterolemia.  At age 29 she had an acute myocardial infarction at that time that she was on birth control pills.  She has an unusual prothrombin factor and has been evaluated by hematology at [facility].  They recommended long-term aspirin.  She is also avoiding birth control pills and hormones.  He said no subsequent problems with angina or myocardial infarction.  She had a catheterization at the time of her myocardial infarction which showed normal coronary arteries and normal LV function with an ejection fraction of 70%.  The patient has not been experiencing any exertional symptoms.  Current Outpatient Prescriptions  Medication Sig Dispense Refill  . Biotin 5000 MCG TABS Take by mouth.        . cetirizine (ZYRTEC) 10 MG tablet Take 10 mg by mouth daily.        . Cholecalciferol (VITAMIN D3) 2000 UNITS TABS Take by mouth.        . Multiple Vitamin (MULTIVITAMIN) tablet Take 1 tablet by mouth daily.        . NON FORMULARY Take 1 tablet by mouth 2 (two) times daily. NATURE'S BOUNTY COMPLETE Menopause support complex       . verapamil (CALAN-SR) 240 MG CR tablet TAKE 1 TABLET DAILY  90 tablet  2  . vitamin C (ASCORBIC ACID) 500 MG tablet Take 500 mg by mouth daily.        . vitamin E 400 UNIT capsule Take 400 Units by mouth daily.          Allergies  Allergen Reactions  . Penicillins   . Sulfa Antibiotics     Patient Active Problem List  Diagnoses  . Hypercholesterolemia  . Non-ST elevation myocardial infarction (NSTEMI), subendocardial infarction, subsequent episode of care  . Acute myocardial infarction  . History of heart attack  . General medical examination  . Clotting disorder   . Depression  . Headache  . Osteoarthritis    History  Smoking status  . Never Smoker   Smokeless tobacco  . Never Used    History  Alcohol Use  . 1.0 - 1.5 oz/week  . 2-3 drink(s) per week    Family History  Problem Relation Age of Onset  . Cardiomyopathy Mother   . Stroke Mother   . Hypertension Mother   . Hyperlipidemia Father   . Heart disease Father   . Arthritis Paternal Grandmother   . Kidney disease Paternal Grandmother     Review of Systems: Constitutional: no fever chills diaphoresis or fatigue or change in weight.  Head and neck: no hearing loss, no epistaxis, no photophobia or visual disturbance. Respiratory: No cough, shortness of breath or wheezing. Cardiovascular: No chest pain peripheral edema, palpitations. Gastrointestinal: No abdominal distention, no abdominal pain, no change in bowel habits hematochezia or melena. Genitourinary: No dysuria, no frequency, no urgency, no nocturia. Musculoskeletal:No arthralgias, no back pain, no gait disturbance or myalgias. Neurological: No dizziness, no headaches, no numbness, no seizures, no syncope, no weakness, no tremors. Hematologic: No lymphadenopathy, no easy bruising. Psychiatric: No confusion, no hallucinations, no sleep disturbance.    Physical Exam: Filed Vitals:   01/07/12 1412  BP: 98/64  Pulse:  72  Resp: 18   the general appearance reveals that of a well-developed well-nourished woman in no distress.The head and neck exam reveals pupils equal and reactive.  Extraocular movements are full.  There is no scleral icterus.  The mouth and pharynx are normal.  The neck is supple.  The carotids reveal no bruits.  The jugular venous pressure is normal.  The  thyroid is not enlarged.  There is no lymphadenopathy.  The chest is clear to percussion and auscultation.  There are no rales or rhonchi.  Expansion of the chest is symmetrical.  The precordium is quiet.  The first heart sound is normal.  The second  heart sound is physiologically split.  There is no murmur gallop rub or click.  There is no abnormal lift or heave.  The abdomen is soft and nontender.  The bowel sounds are normal.  The liver and spleen are not enlarged.  There are no abdominal masses.  There are no abdominal bruits.  Extremities reveal good pedal pulses.  There is no phlebitis or edema.  There is no cyanosis or clubbing.  Strength is normal and symmetrical in all extremities.  There is no lateralizing weakness.  There are no sensory deficits.  The skin is warm and dry.  There is no rash.  EKG today shows sinus rhythm and nonspecific T wave abnormality and no ischemic changes.   Assessment / Plan: Continue same medication.  Continue regular exercise.  Her exercise has been diminished recently because of having had a left bunionectomy operation in November.  She will be rechecked in 6 months for followup office visit and fasting lab work at that time.

## 2012-01-07 NOTE — Assessment & Plan Note (Signed)
The patient is no longer taking a baby aspirin because of problems with repeated nosebleeds.

## 2012-01-07 NOTE — Assessment & Plan Note (Signed)
We did not obtain lab work here because the patient had recent lab work elsewhere which is to be sent to [place].

## 2012-01-07 NOTE — Patient Instructions (Signed)
Your physician recommends that you continue on your current medications as directed. Please refer to the Current Medication list given to you today.  Your physician wants you to follow-up in: 6 months. You will receive a reminder letter in the mail two months in advance. If you don't receive a letter, please call our office to schedule the follow-up appointment.  

## 2012-01-10 NOTE — Progress Notes (Signed)
Addended by: Judithe Modest D on: 01/10/2012 09:28 AM   Modules accepted: Orders

## 2012-01-31 LAB — HM COLONOSCOPY: HM Colonoscopy: NORMAL

## 2012-03-18 ENCOUNTER — Encounter: Payer: Self-pay | Admitting: Cardiology

## 2012-07-31 ENCOUNTER — Encounter: Payer: Self-pay | Admitting: Family

## 2012-07-31 ENCOUNTER — Ambulatory Visit (INDEPENDENT_AMBULATORY_CARE_PROVIDER_SITE_OTHER): Payer: BC Managed Care – PPO | Admitting: Family

## 2012-07-31 VITALS — BP 100/70 | HR 76 | Temp 98.1°F | Resp 16 | Ht 65.0 in | Wt 137.1 lb

## 2012-07-31 DIAGNOSIS — E7800 Pure hypercholesterolemia, unspecified: Secondary | ICD-10-CM

## 2012-07-31 DIAGNOSIS — R51 Headache: Secondary | ICD-10-CM

## 2012-07-31 DIAGNOSIS — M1990 Unspecified osteoarthritis, unspecified site: Secondary | ICD-10-CM

## 2012-07-31 DIAGNOSIS — I10 Essential (primary) hypertension: Secondary | ICD-10-CM

## 2012-07-31 NOTE — Assessment & Plan Note (Signed)
On verapamil per Dr. Patty Sermons.  BP remains stable.  BP Readings from Last 3 Encounters:  07/31/12 100/70  01/07/12 98/64  08/02/11 96/70

## 2012-07-31 NOTE — Assessment & Plan Note (Signed)
Total cholesterol 201, LDL 114.  We discussed goal LDL<100. She will continue to work hard on a low fat/low cholesterol diet and exercise.

## 2012-07-31 NOTE — Assessment & Plan Note (Signed)
Stable, no changes  

## 2012-07-31 NOTE — Assessment & Plan Note (Signed)
Unchanged, monitor

## 2012-07-31 NOTE — Patient Instructions (Addendum)
Please follow up in 1 year for a fasting physical.

## 2012-07-31 NOTE — Progress Notes (Signed)
Subjective:    Patient ID: Lindsay [name], female    DOB: June 04, 1961, 51 y.o.   MRN: [id number]  HPI  [name] is a 51 yr old female who presents today for follow up.  Depression- pt reports that this was situational and remains well controlled.  HA- reports rare headaches.  Arthritis-  Left knee bothers her. She will often switch up her work out routines temporarily to lower impact which helps.  Preventative- reports Last pap end of January of this year. Had follow up colo in march- no polyps. Mammo also completed in January per GYN.  She brings with her today fasting lab work which is reviewed.   Review of Systems See HPI  Past Medical History  Diagnosis Date  . Hyperlipidemia   . Acute myocardial infarction     normal coronary arteries  . Hypertension   . Arthritis   . History of chicken pox   . Depression   . Allergy   . Migraines   . History of blood clots     Thought to be due to Ortho Evra patch  . History of heart attack 2003    thought to be due to hormone replacement therapy  . Arrhythmia   . History of colon polyps   . Osteopenia   . Basal cell carcinoma   . Clotting disorder 08/06/2011  . Osteoarthritis 08/06/2011    History   Social History  . Marital Status: Legally Separated    Spouse Name: N/A    Number of Children: 1  . Years of Education: N/A   Occupational History  . Not on file.   Social History Main Topics  . Smoking status: Never Smoker   . Smokeless tobacco: Never Used  . Alcohol Use: 1.0 - 1.5 oz/week    2-3 drink(s) per week  . Drug Use: No  . Sexually Active: Not on file   Other Topics Concern  . Not on file   Social History Narrative   Regular exercise:  Yes. At least 2 x weeklyCaffeine Use:  2 cups coffee daily.Bank operations- BBTMarried- she has a 37 year old daughter (lives with her father)    Past Surgical History  Procedure Date  . Nasal sinus surgery     Dr. Anner Crete  . Cardiac catheterization 08/30/2002    Dr.  [place]  . Dilation and curettage of uterus 1978, 1998  . Bunionectomy 2008  . Rotator cuff repair 2010  . Fracture surgery 01/2005    left arm    Family History  Problem Relation Age of Onset  . Cardiomyopathy Mother   . Stroke Mother   . Hypertension Mother   . Hyperlipidemia Father   . Heart disease Father   . Arthritis Paternal Grandmother   . Kidney disease Paternal Grandmother     Allergies  Allergen Reactions  . Penicillins   . Sulfa Antibiotics     Current Outpatient Prescriptions on File Prior to Visit  Medication Sig Dispense Refill  . Biotin 5000 MCG TABS Take by mouth.        . Calcium 500 MG CHEW Chew 2 each by mouth daily.      . cetirizine (ZYRTEC) 10 MG tablet Take 10 mg by mouth daily.        . Cholecalciferol (VITAMIN D3) 2000 UNITS TABS Take by mouth.        . Multiple Vitamin (MULTIVITAMIN) tablet Take 1 tablet by mouth daily.        Marland Kitchen  NON FORMULARY Take 1 tablet by mouth 2 (two) times daily. NATURE'S BOUNTY COMPLETE Menopause support complex       . verapamil (CALAN-SR) 240 MG CR tablet TAKE 1 TABLET DAILY  90 tablet  2  . vitamin C (ASCORBIC ACID) 500 MG tablet Take 500 mg by mouth daily.        . vitamin E 400 UNIT capsule Take 400 Units by mouth daily.          BP 100/70  Pulse 76  Temp 98.1 F (36.7 C) (Oral)  Resp 16  Ht 5\' 5"  (1.651 m)  Wt 137 lb 1.3 oz (62.179 kg)  BMI 22.81 kg/m2  SpO2 99%  LMP 07/04/2012       Objective:   Physical Exam  Constitutional: She appears well-developed and well-nourished. No distress.  HENT:  Head: Normocephalic and atraumatic.  Cardiovascular: Normal rate and regular rhythm.   No murmur heard. Pulmonary/Chest: Effort normal and breath sounds normal. No respiratory distress. She has no wheezes. She has no rales. She exhibits no tenderness.  Musculoskeletal: She exhibits no edema.  Skin: Skin is warm and dry. No rash noted. No erythema. No pallor.  Psychiatric: She has a normal mood and affect. Her  behavior is normal. Judgment and thought content normal.          Assessment & Plan:

## 2012-10-06 ENCOUNTER — Encounter: Payer: Self-pay | Admitting: Cardiology

## 2012-10-06 ENCOUNTER — Ambulatory Visit (INDEPENDENT_AMBULATORY_CARE_PROVIDER_SITE_OTHER): Payer: BC Managed Care – PPO | Admitting: Cardiology

## 2012-10-06 VITALS — BP 110/70 | HR 77 | Resp 18 | Ht 64.8 in | Wt 141.0 lb

## 2012-10-06 DIAGNOSIS — E7800 Pure hypercholesterolemia, unspecified: Secondary | ICD-10-CM

## 2012-10-06 NOTE — Progress Notes (Signed)
Lindsay [name] Date of Birth:  08/26/1961 [facility] [address] [place], [place]  [address] [phone]  Fax   [phone]  HPI: This pleasant 51 year old woman is seen for a six-month followup office visit. She has a past history of hypercholesterolemia. At age 26 she had an acute myocardial infarction at that time that she was on birth control pills. She has an unusual prothrombin factor and has been evaluated by hematology at [facility]. They recommended long-term aspirin. She is also avoiding birth control pills and hormones. He said no subsequent problems with angina or myocardial infarction. She had a catheterization at the time of her myocardial infarction which showed normal coronary arteries and normal LV function with an ejection fraction of 70%. The patient has not been experiencing any exertional symptoms.   Current Outpatient Prescriptions  Medication Sig Dispense Refill  . Biotin 5000 MCG TABS Take by mouth.        . Calcium 500 MG CHEW Chew 2 each by mouth daily.      . cetirizine (ZYRTEC) 10 MG tablet Take 10 mg by mouth daily.        . Cholecalciferol (VITAMIN D3) 2000 UNITS TABS Take by mouth.        . Garlic TABS Take 1 tablet by mouth daily. GARLIC COMPLEX      . Multiple Vitamin (MULTIVITAMIN) tablet Take 1 tablet by mouth daily.        . NON FORMULARY Take 1 tablet by mouth 2 (two) times daily. NATURE'S BOUNTY COMPLETE Menopause support complex       . Omega-3 Fatty Acids (FISH OIL) 1200 MG CAPS Take 1,200 mg by mouth 2 (two) times daily.       . verapamil (CALAN-SR) 240 MG CR tablet TAKE 1 TABLET DAILY  90 tablet  2  . vitamin C (ASCORBIC ACID) 500 MG tablet Take 500 mg by mouth daily.        . vitamin E 400 UNIT capsule Take 400 Units by mouth daily.          Allergies  Allergen Reactions  . Penicillins   . Sulfa Antibiotics     Patient Active Problem List  Diagnosis  . Hypercholesterolemia  . Non-ST elevation myocardial  infarction (NSTEMI), subendocardial infarction, subsequent episode of care  . Acute myocardial infarction  . History of heart attack  . General medical examination  . Clotting disorder  . Depression  . Headache  . Osteoarthritis  . HTN (hypertension)    History  Smoking status  . Never Smoker   Smokeless tobacco  . Never Used    History  Alcohol Use  . 1.0 - 1.5 oz/week  . 2-3 drink(s) per week    Family History  Problem Relation Age of Onset  . Cardiomyopathy Mother   . Stroke Mother   . Hypertension Mother   . Hyperlipidemia Father   . Heart disease Father   . Arthritis Paternal Grandmother   . Kidney disease Paternal Grandmother     Review of Systems: The patient denies any heat or cold intolerance.  No weight gain or weight loss.  The patient denies headaches or blurry vision.  There is no cough or sputum production.  The patient denies dizziness.  There is no hematuria or hematochezia.  The patient denies any muscle aches or arthritis.  The patient denies any rash.  The patient denies frequent falling or instability.  There is no history of depression or anxiety.  All other systems were reviewed and are negative.   Physical Exam: Filed Vitals:   10/06/12 1402  BP: 110/70  Pulse: 77  Resp: 18   the general appearance reveals a well-developed well-nourished woman in no distress.The head and neck exam reveals pupils equal and reactive.  Extraocular movements are full.  There is no scleral icterus.  The mouth and pharynx are normal.  The neck is supple.  The carotids reveal no bruits.  The jugular venous pressure is normal.  The  thyroid is not enlarged.  There is no lymphadenopathy.  The chest is clear to percussion and auscultation.  There are no rales or rhonchi.  Expansion of the chest is symmetrical.  The precordium is quiet.  The first heart sound is normal.  The second heart sound is physiologically split.  There is no murmur gallop rub or click.  There is no  abnormal lift or heave.  The abdomen is soft and nontender.  The bowel sounds are normal.  The liver and spleen are not enlarged.  There are no abdominal masses.  There are no abdominal bruits.  Extremities reveal good pedal pulses.  There is no phlebitis or edema.  There is no cyanosis or clubbing.  Strength is normal and symmetrical in all extremities.  There is no lateralizing weakness.  There are no sensory deficits.  The skin is warm and dry.  There is no rash.      Assessment / Plan: Recheck in 6 months.  Get office visit EKG, lipid panel hepatic function panel and basal metabolic panel at that time.. increase fish oil to twice a day

## 2012-10-06 NOTE — Assessment & Plan Note (Signed)
The patient has had no recurrent chest pain or angina.  He exercises regularly at the gym.  She is discouraged her weight is up 3 pounds.  She tried some of the weight gain to menopause

## 2012-10-06 NOTE — Assessment & Plan Note (Signed)
We reviewed her recent labs from her employer it shows that her LDL is 233 and total cholesterol is 223.  She does not want to take statin drugs.  He has been on fish oil once a day and she will increase that to twice a day at this point and work harder on diet and cardio exercise

## 2012-10-06 NOTE — Patient Instructions (Addendum)
Increase your fish oil to twice a day  Your physician wants you to follow-up in: 6 months with fasting labs (lp/bmet/hfp)  You will receive a reminder letter in the mail two months in advance. If you don't receive a letter, please call our office to schedule the follow-up appointment.

## 2012-11-09 ENCOUNTER — Emergency Department (HOSPITAL_BASED_OUTPATIENT_CLINIC_OR_DEPARTMENT_OTHER)
Admission: EM | Admit: 2012-11-09 | Discharge: 2012-11-09 | Disposition: A | Discharge status: Home / Self Care | Payer: BC Managed Care – PPO | Attending: Emergency Medicine | Admitting: Emergency Medicine

## 2012-11-09 ENCOUNTER — Encounter (HOSPITAL_BASED_OUTPATIENT_CLINIC_OR_DEPARTMENT_OTHER): Payer: Self-pay | Admitting: *Deleted

## 2012-11-09 DIAGNOSIS — I252 Old myocardial infarction: Secondary | ICD-10-CM | POA: Insufficient documentation

## 2012-11-09 DIAGNOSIS — Z860100 Personal history of colon polyps, unspecified: Secondary | ICD-10-CM | POA: Insufficient documentation

## 2012-11-09 DIAGNOSIS — D689 Coagulation defect, unspecified: Secondary | ICD-10-CM | POA: Insufficient documentation

## 2012-11-09 DIAGNOSIS — Z8659 Personal history of other mental and behavioral disorders: Secondary | ICD-10-CM | POA: Insufficient documentation

## 2012-11-09 DIAGNOSIS — Z85828 Personal history of other malignant neoplasm of skin: Secondary | ICD-10-CM | POA: Insufficient documentation

## 2012-11-09 DIAGNOSIS — Y9389 Activity, other specified: Secondary | ICD-10-CM | POA: Insufficient documentation

## 2012-11-09 DIAGNOSIS — E785 Hyperlipidemia, unspecified: Secondary | ICD-10-CM | POA: Insufficient documentation

## 2012-11-09 DIAGNOSIS — S61219A Laceration without foreign body of unspecified finger without damage to nail, initial encounter: Secondary | ICD-10-CM

## 2012-11-09 DIAGNOSIS — Z79899 Other long term (current) drug therapy: Secondary | ICD-10-CM | POA: Insufficient documentation

## 2012-11-09 DIAGNOSIS — W260XXA Contact with knife, initial encounter: Secondary | ICD-10-CM | POA: Insufficient documentation

## 2012-11-09 DIAGNOSIS — I10 Essential (primary) hypertension: Secondary | ICD-10-CM | POA: Insufficient documentation

## 2012-11-09 DIAGNOSIS — Z8619 Personal history of other infectious and parasitic diseases: Secondary | ICD-10-CM | POA: Insufficient documentation

## 2012-11-09 DIAGNOSIS — Z8669 Personal history of other diseases of the nervous system and sense organs: Secondary | ICD-10-CM | POA: Insufficient documentation

## 2012-11-09 DIAGNOSIS — S61209A Unspecified open wound of unspecified finger without damage to nail, initial encounter: Secondary | ICD-10-CM | POA: Insufficient documentation

## 2012-11-09 DIAGNOSIS — Z8739 Personal history of other diseases of the musculoskeletal system and connective tissue: Secondary | ICD-10-CM | POA: Insufficient documentation

## 2012-11-09 DIAGNOSIS — Z8679 Personal history of other diseases of the circulatory system: Secondary | ICD-10-CM | POA: Insufficient documentation

## 2012-11-09 DIAGNOSIS — Z862 Personal history of diseases of the blood and blood-forming organs and certain disorders involving the immune mechanism: Secondary | ICD-10-CM | POA: Insufficient documentation

## 2012-11-09 DIAGNOSIS — Y929 Unspecified place or not applicable: Secondary | ICD-10-CM | POA: Insufficient documentation

## 2012-11-09 NOTE — ED Notes (Signed)
Patient states she cut her left index finger last night at 1800 while cutting food.  Bleeding has continued thru out the night.  Hx of clotting disorder and takes verapamil for that.

## 2012-11-09 NOTE — ED Notes (Signed)
Dr. Radford Pax and Dr. Algernon Huxley at bedside, pt is smiling and cooperative. Supplies gathered: quick clot powder, and placed at bedside per md request.

## 2012-11-09 NOTE — ED Provider Notes (Signed)
History     CSN: [id number]  Arrival date & time 11/09/12  0810   First MD Initiated Contact with Patient 11/09/12 0831      Chief Complaint  Patient presents with  . Extremity Laceration    left index finger    (Consider location/radiation/quality/duration/timing/severity/associated sxs/prior treatment) HPI 51yo F with hypercoagulopathy presents with a left index finger laceration that continues to ooze >12hrs from the event. She sliced off the tip of her finger with a serrated knife at 6pm while cutting biscotti. She states that she wrapped her finger with gauze but has gone through 3 sets of 2-3 gauze pads since cutting her finger. She denies any previous history of prolonged bleeding.  Past Medical History  Diagnosis Date  . Hyperlipidemia   . Acute myocardial infarction     normal coronary arteries  . Hypertension   . Arthritis   . History of chicken pox   . Depression   . Allergy   . Migraines   . History of blood clots     Thought to be due to Ortho Evra patch  . History of heart attack 2003    thought to be due to hormone replacement therapy  . Arrhythmia   . History of colon polyps   . Osteopenia   . Basal cell carcinoma   . Clotting disorder 08/06/2011  . Osteoarthritis 08/06/2011    Past Surgical History  Procedure Date  . Nasal sinus surgery     Dr. Anner Crete  . Cardiac catheterization 08/30/2002    Dr. [place]  . Dilation and curettage of uterus 1978, 1998  . Bunionectomy 2008  . Rotator cuff repair 2010  . Fracture surgery 01/2005    left arm    Family History  Problem Relation Age of Onset  . Cardiomyopathy Mother   . Stroke Mother   . Hypertension Mother   . Hyperlipidemia Father   . Heart disease Father   . Arthritis Paternal Grandmother   . Kidney disease Paternal Grandmother     History  Substance Use Topics  . Smoking status: Never Smoker   . Smokeless tobacco: Never Used  . Alcohol Use: 1.0 - 1.5 oz/week    2-3 drink(s) per week   Comment: weekly    OB History    Grav Para Term Preterm Abortions TAB SAB Ect Mult Living                  Review of Systems  All other systems reviewed and are negative.    Allergies  Penicillins and Sulfa antibiotics  Home Medications   Current Outpatient Rx  Name  Route  Sig  Dispense  Refill  . BIOTIN 5000 MCG PO TABS   Oral   Take by mouth.           Marland Kitchen CALCIUM 500 MG PO CHEW   Oral   Chew 2 each by mouth daily.         Marland Kitchen CETIRIZINE HCL 10 MG PO TABS   Oral   Take 10 mg by mouth daily.           Marland Kitchen VITAMIN D3 2000 UNITS PO TABS   Oral   Take by mouth.           Marland Kitchen GARLIC PO TABS   Oral   Take 1 tablet by mouth daily. GARLIC COMPLEX         . ONE-DAILY MULTI VITAMINS PO TABS   Oral   Take 1  tablet by mouth daily.           . NON FORMULARY   Oral   Take 1 tablet by mouth 2 (two) times daily. NATURE'S BOUNTY COMPLETE Menopause support complex          . FISH OIL 1200 MG PO CAPS   Oral   Take 1,200 mg by mouth 2 (two) times daily.          Marland Kitchen VERAPAMIL HCL ER 240 MG PO TBCR      TAKE 1 TABLET DAILY   90 tablet   2   . VITAMIN C 500 MG PO TABS   Oral   Take 500 mg by mouth daily.           Marland Kitchen VITAMIN E 400 UNITS PO CAPS   Oral   Take 400 Units by mouth daily.             BP 128/85  Pulse 79  Temp 98.5 F (36.9 C) (Oral)  Resp 20  SpO2 99%  LMP 10/06/2012  Physical Exam  Constitutional: She is oriented to person, place, and time. She appears well-developed and well-nourished.  HENT:  Head: Normocephalic and atraumatic.  Eyes: EOM are normal. Pupils are equal, round, and reactive to light.  Cardiovascular: Normal rate and regular rhythm.   Pulmonary/Chest: Effort normal.  Abdominal: Soft. She exhibits no distension.  Musculoskeletal: She exhibits no edema.  Neurological: She is alert and oriented to person, place, and time. No cranial nerve deficit.  Skin:       5x3 mm area of missing skin on the lateral portion of  the left index finger extending into the dermis, slowly oozing blood  Psychiatric: She has a normal mood and affect. Her behavior is normal. Thought content normal.    ED Course  Procedures (including critical care time)  Labs Reviewed - No data to display No results found.   No diagnosis found.    MDM   51yo F with hypercoagulopathy presents with a left index finger laceration that continues to ooze >12hrs from the event. The wound was cleaned with sterile saline, and even after applying pressure, the wound continued to ooze. Wound Seal was applied to the laceration, and after pressure was held for 2 minutes, the wound was redressed, and the patient was discharged to home.        Genelle Gather, MD 11/09/12 [phone]

## 2012-11-09 NOTE — ED Provider Notes (Signed)
I saw and evaluated the patient, reviewed the resident's note and I agree with the findings and plan.   .Face to face Exam:  General:  Awake HEENT:  Atraumatic Resp:  Normal effort Abd:  Nondistended Neuro:No focal weakness Lymph: No adenopathy I saw and evaluated the patient, reviewed the resident's note and I agree with the findings and plan.   .Face to face Exam:  General:  Awake HEENT:  Atraumatic Resp:  Normal effort Abd:  Nondistended Neuro:No focal weakness Lymph: No adenopathy   Nelia Shi, MD 11/09/12 2254

## 2012-12-28 ENCOUNTER — Telehealth: Payer: Self-pay

## 2012-12-28 MED ORDER — VERAPAMIL HCL ER 240 MG PO TBCR: 240.0000 mg | EXTENDED_RELEASE_TABLET | Freq: Every day | ORAL | Status: DC

## 2012-12-28 NOTE — Telephone Encounter (Signed)
Refilled as requested  

## 2012-12-28 NOTE — Telephone Encounter (Signed)
Patient called in needing a refill of Verapamil 240 mg once a day [facility] Reference # [id number]

## 2013-01-02 LAB — HM PAP SMEAR: HM Pap smear: NORMAL

## 2013-01-02 LAB — HM DEXA SCAN

## 2013-02-09 ENCOUNTER — Telehealth: Payer: Self-pay | Admitting: Cardiology

## 2013-02-09 NOTE — Telephone Encounter (Signed)
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  Please fax to [phone].

## 2013-02-09 NOTE — Telephone Encounter (Signed)
Will fax as requested

## 2013-03-30 ENCOUNTER — Encounter: Payer: Self-pay | Admitting: Cardiology

## 2013-04-12 ENCOUNTER — Ambulatory Visit (INDEPENDENT_AMBULATORY_CARE_PROVIDER_SITE_OTHER): Payer: BC Managed Care – PPO | Admitting: Cardiology

## 2013-04-12 ENCOUNTER — Encounter: Payer: Self-pay | Admitting: Cardiology

## 2013-04-12 VITALS — BP 122/68 | HR 76 | Ht 64.0 in | Wt 136.6 lb

## 2013-04-12 DIAGNOSIS — I214 Non-ST elevation (NSTEMI) myocardial infarction: Secondary | ICD-10-CM

## 2013-04-12 DIAGNOSIS — I252 Old myocardial infarction: Secondary | ICD-10-CM

## 2013-04-12 DIAGNOSIS — E7800 Pure hypercholesterolemia, unspecified: Secondary | ICD-10-CM

## 2013-04-12 DIAGNOSIS — I10 Essential (primary) hypertension: Secondary | ICD-10-CM

## 2013-04-12 NOTE — Progress Notes (Signed)
Lindsay [name] Date of Birth:  1961/08/18 [facility] [address] [place], [place]  [address] [phone]  Fax   [phone]  HPI: This pleasant 52 year old woman is seen for a six-month followup office visit. She has a past history of hypercholesterolemia. At age 89 she had an acute myocardial infarction at that time that she was on birth control pills. She has an unusual prothrombin factor and has been evaluated by hematology at [facility]. They recommended long-term aspirin. She is also avoiding birth control pills and hormones. He said no subsequent problems with angina or myocardial infarction. She had a catheterization at the time of her myocardial infarction which showed normal coronary arteries and normal LV function with an ejection fraction of 70%. The patient has not been experiencing any exertional symptoms.   Current Outpatient Prescriptions  Medication Sig Dispense Refill  . Biotin 5000 MCG TABS Take by mouth.        . Calcium 500 MG CHEW Chew 2 each by mouth daily.      . cetirizine (ZYRTEC) 10 MG tablet Take 10 mg by mouth daily.        . Cholecalciferol (VITAMIN D3) 2000 UNITS TABS Take by mouth.        . Garlic TABS Take 1 tablet by mouth daily. GARLIC COMPLEX      . MAGNESIUM CITRATE PO Take by mouth 2 (two) times daily. Takes 2 tablets twice a day      . Multiple Vitamin (MULTIVITAMIN) tablet Take 1 tablet by mouth daily.        . NON FORMULARY Take 1 tablet by mouth 2 (two) times daily. NATURE'S BOUNTY COMPLETE Menopause support complex       . Omega-3 Fatty Acids (FISH OIL) 1200 MG CAPS Take 1,200 mg by mouth 2 (two) times daily.       Marland Kitchen OVER THE COUNTER MEDICATION Take 450 mg by mouth 2 (two) times daily. Holy basil      . verapamil (CALAN-SR) 240 MG CR tablet Take 1 tablet (240 mg total) by mouth daily.  90 tablet  3  . vitamin C (ASCORBIC ACID) 500 MG tablet Take 500 mg by mouth daily.        . vitamin E 400 UNIT capsule Take 400 Units by  mouth daily.         No current facility-administered medications for this visit.    Allergies  Allergen Reactions  . Penicillins   . Sulfa Antibiotics     Patient Active Problem List   Diagnosis Date Noted  . HTN (hypertension) 07/31/2012  . General medical examination 08/06/2011  . Clotting disorder 08/06/2011  . Depression 08/06/2011  . Headache 08/06/2011  . Osteoarthritis 08/06/2011  . History of heart attack   . Hypercholesterolemia 07/08/2011  . Non-ST elevation myocardial infarction (NSTEMI), subendocardial infarction, subsequent episode of care 07/08/2011  . Acute myocardial infarction     History  Smoking status  . Never Smoker   Smokeless tobacco  . Never Used    History  Alcohol Use  . 1 - 1.5 oz/week  . 2-3 drink(s) per week    Comment: weekly    Family History  Problem Relation Age of Onset  . Cardiomyopathy Mother   . Stroke Mother   . Hypertension Mother   . Hyperlipidemia Father   . Heart disease Father   . Arthritis Paternal Grandmother   . Kidney disease Paternal Grandmother     Review of Systems: The  patient denies any heat or cold intolerance.  No weight gain or weight loss.  The patient denies headaches or blurry vision.  There is no cough or sputum production.  The patient denies dizziness.  There is no hematuria or hematochezia.  The patient denies any muscle aches or arthritis.  The patient denies any rash.  The patient denies frequent falling or instability.  There is no history of depression or anxiety.  All other systems were reviewed and are negative.   Physical Exam: Filed Vitals:   04/12/13 1612  BP: 122/68  Pulse: 76   the general appearance reveals a well-developed well-nourished woman in no distress.The head and neck exam reveals pupils equal and reactive.  Extraocular movements are full.  There is no scleral icterus.  The mouth and pharynx are normal.  The neck is supple.  The carotids reveal no bruits.  The jugular venous  pressure is normal.  The  thyroid is not enlarged.  There is no lymphadenopathy.  The chest is clear to percussion and auscultation.  There are no rales or rhonchi.  Expansion of the chest is symmetrical.  The precordium is quiet.  The first heart sound is normal.  The second heart sound is physiologically split.  There is no murmur gallop rub or click.  There is no abnormal lift or heave.  The abdomen is soft and nontender.  The bowel sounds are normal.  The liver and spleen are not enlarged.  There are no abdominal masses.  There are no abdominal bruits.  Extremities reveal good pedal pulses.  There is no phlebitis or edema.  There is no cyanosis or clubbing.  Strength is normal and symmetrical in all extremities.  There is no lateralizing weakness.  There are no sensory deficits.  The skin is warm and dry.  There is no rash.  EKG shows normal sinus rhythm and is within normal limits.    Assessment / Plan: Continue on same medication.  Recheck in 6 months for followup office visit.  Bring lab with her next visit

## 2013-04-12 NOTE — Patient Instructions (Addendum)
Your physician recommends that you continue on your current medications as directed. Please refer to the Current Medication list given to you today.  Your physician wants you to follow-up in: 6 MONTH You will receive a reminder letter in the mail two months in advance. If you don't receive a letter, please call our office to schedule the follow-up appointment.  

## 2013-04-12 NOTE — Assessment & Plan Note (Signed)
The patient has not been experiencing any exertional chest pain or tightness

## 2013-04-12 NOTE — Assessment & Plan Note (Signed)
The patient has been getting her blood work drawn at work as part of annual health assessment.  She will bring in her results with her at her next visit.  She has lost 5 pounds since last visit and is put himself on a low gluten low sugar low dairy diet

## 2013-04-12 NOTE — Assessment & Plan Note (Signed)
No recurrent chest pain or other cardiac symptoms.

## 2013-04-12 NOTE — Assessment & Plan Note (Signed)
Blood pressure was remaining stable on current therapy.  No headaches or dizziness.  No syncope.  She has been doing Pilates exercises for staying in shape.

## 2013-07-21 ENCOUNTER — Ambulatory Visit (INDEPENDENT_AMBULATORY_CARE_PROVIDER_SITE_OTHER): Payer: BC Managed Care – PPO | Admitting: Family

## 2013-07-21 ENCOUNTER — Telehealth: Payer: Self-pay | Admitting: Family

## 2013-07-21 ENCOUNTER — Ambulatory Visit (HOSPITAL_BASED_OUTPATIENT_CLINIC_OR_DEPARTMENT_OTHER)
Admission: RE | Admit: 2013-07-21 | Discharge: 2013-07-21 | Disposition: A | Discharge status: Home / Self Care | Payer: BC Managed Care – PPO | Source: Ambulatory Visit | Attending: Family | Admitting: Family

## 2013-07-21 ENCOUNTER — Encounter: Payer: Self-pay | Admitting: Family

## 2013-07-21 VITALS — BP 110/78 | HR 67 | Temp 98.3°F | Resp 16 | Ht 65.0 in | Wt 139.0 lb

## 2013-07-21 DIAGNOSIS — J329 Chronic sinusitis, unspecified: Secondary | ICD-10-CM

## 2013-07-21 DIAGNOSIS — E041 Nontoxic single thyroid nodule: Secondary | ICD-10-CM

## 2013-07-21 DIAGNOSIS — J029 Acute pharyngitis, unspecified: Secondary | ICD-10-CM

## 2013-07-21 DIAGNOSIS — E049 Nontoxic goiter, unspecified: Secondary | ICD-10-CM

## 2013-07-21 DIAGNOSIS — E010 Iodine-deficiency related diffuse (endemic) goiter: Secondary | ICD-10-CM

## 2013-07-21 LAB — T4, FREE: Free T4: 1.39 ng/dL (ref 0.80–1.80)

## 2013-07-21 MED ORDER — CEFUROXIME AXETIL 500 MG PO TABS: 500.0000 mg | ORAL_TABLET | Freq: Two times a day (BID) | ORAL | Status: DC

## 2013-07-21 NOTE — Addendum Note (Signed)
Addended by: Mervin Kung A on: 07/21/2013 09:31 AM   Modules accepted: Orders

## 2013-07-21 NOTE — Assessment & Plan Note (Signed)
She reports that she has tolerated cephalosporins in the past.  Will rx with ceftin.  Rapid strep neg, will send strep probe.

## 2013-07-21 NOTE — Progress Notes (Signed)
Subjective:    Patient ID: Lindsay [name], female    DOB: 1961-11-26, 52 y.o.   MRN: [id number]  HPI  Ms. Spagna is a 52 yr old female who presents today with chief complaint of sore throat.   Reports 2 weeks ago she had sore throat. On 8/8.  [facility]  She was treated for strep with z pak. Symptoms resolved.  Has not checked her temperature.  She reports some associated nasal drainage which is worse than her usual allergies.  She continues zyrtec.     Review of Systems See HPI  Past Medical History  Diagnosis Date  . Hyperlipidemia   . Acute myocardial infarction     normal coronary arteries  . Hypertension   . Arthritis   . History of chicken pox   . Depression   . Allergy   . Migraines   . History of blood clots     Thought to be due to Ortho Evra patch  . History of heart attack 2003    thought to be due to hormone replacement therapy  . Arrhythmia   . History of colon polyps   . Osteopenia   . Basal cell carcinoma   . Clotting disorder 08/06/2011  . Osteoarthritis 08/06/2011    History   Social History  . Marital Status: Legally Separated    Spouse Name: N/A    Number of Children: 1  . Years of Education: N/A   Occupational History  . Not on file.   Social History Main Topics  . Smoking status: Never Smoker   . Smokeless tobacco: Never Used  . Alcohol Use: 1 - 1.5 oz/week    2-3 drink(s) per week     Comment: weekly  . Drug Use: No  . Sexual Activity: Not on file   Other Topics Concern  . Not on file   Social History Narrative   Regular exercise:  Yes. At least 2 x weekly   Caffeine Use:  2 cups coffee daily.   [employer]- [employer]   Married- she has a 25 year old daughter (lives with her father)                Past Surgical History  Procedure Laterality Date  . Nasal sinus surgery      Dr. Anner Crete  . Cardiac catheterization  08/30/2002    Dr. [place]  . Dilation and curettage of uterus  1978, 1998  . Bunionectomy  2008  .  Rotator cuff repair  2010  . Fracture surgery  01/2005    left arm    Family History  Problem Relation Age of Onset  . Cardiomyopathy Mother   . Stroke Mother   . Hypertension Mother   . Hyperlipidemia Father   . Heart disease Father   . Arthritis Paternal Grandmother   . Kidney disease Paternal Grandmother     Allergies  Allergen Reactions  . Penicillins   . Sulfa Antibiotics     Current Outpatient Prescriptions on File Prior to Visit  Medication Sig Dispense Refill  . Biotin 5000 MCG TABS Take by mouth.        . Calcium 500 MG CHEW Chew 2 each by mouth daily.      . cetirizine (ZYRTEC) 10 MG tablet Take 10 mg by mouth daily.        . Cholecalciferol (VITAMIN D3) 2000 UNITS TABS Take by mouth.        . Garlic TABS Take 1  tablet by mouth daily. GARLIC COMPLEX      . MAGNESIUM CITRATE PO Take 400 Units by mouth 2 (two) times daily.       . Multiple Vitamin (MULTIVITAMIN) tablet Take 1 tablet by mouth daily.        . NON FORMULARY Take 1 tablet by mouth 2 (two) times daily. NATURE'S BOUNTY COMPLETE Menopause support complex       . OVER THE COUNTER MEDICATION Take 450 mg by mouth 2 (two) times daily. Holy basil      . verapamil (CALAN-SR) 240 MG CR tablet Take 1 tablet (240 mg total) by mouth daily.  90 tablet  3  . vitamin E 400 UNIT capsule Take 400 Units by mouth daily.        . vitamin C (ASCORBIC ACID) 500 MG tablet Take 500 mg by mouth daily.         No current facility-administered medications on file prior to visit.    BP 110/78  Pulse 67  Temp(Src) 98.3 F (36.8 C) (Oral)  Resp 16  Ht 5\' 5"  (1.651 m)  Wt 139 lb (63.05 kg)  BMI 23.13 kg/m2  SpO2 99%       Objective:   Physical Exam  Constitutional: She is oriented to person, place, and time. She appears well-developed and well-nourished. No distress.  HENT:  Head: Normocephalic and atraumatic.  Right Ear: Tympanic membrane and ear canal normal.  Left Ear: Tympanic membrane and ear canal normal.   Mouth/Throat: No oropharyngeal exudate, posterior oropharyngeal edema, posterior oropharyngeal erythema or tonsillar abscesses.  No frontal or maxillary sinus tenderness to palpation  Neck:  Mild thyroid enlargement noted. R lobe appears fuller than left  Cardiovascular: Normal rate and regular rhythm.   No murmur heard. Pulmonary/Chest: Effort normal and breath sounds normal. No respiratory distress. She has no wheezes. She has no rales. She exhibits no tenderness.  Musculoskeletal: She exhibits no edema.  Neurological: She is alert and oriented to person, place, and time.  Psychiatric: She has a normal mood and affect. Her behavior is normal. Judgment and thought content normal.          Assessment & Plan:

## 2013-07-21 NOTE — Assessment & Plan Note (Signed)
New.  Check ultrasound and Thyroid studies.

## 2013-07-21 NOTE — Telephone Encounter (Signed)
Reviewed Ultrasound of neck.  Notes multiple subcentimeter nodules.  Notes 1.9 cm predominately solid nodule in the left upper pole.  Spoke with pt. Advised pt to proceed with thyroid biopsy.  She is agreeable. Will arrange.

## 2013-07-21 NOTE — Patient Instructions (Addendum)
Please complete lab work prior to leaving. Schedule thyroid ultrasound on the first floor in the imaging department. Start ceftin. Call if symptoms worsen , or if not improved in 2-3 days.

## 2013-07-23 NOTE — Addendum Note (Signed)
Addended by: Sandford Craze on: 07/23/2013 11:48 AM   Modules accepted: Orders

## 2013-07-29 ENCOUNTER — Other Ambulatory Visit (HOSPITAL_COMMUNITY)
Admission: RE | Admit: 2013-07-29 | Discharge: 2013-07-29 | Disposition: A | Discharge status: Home / Self Care | Payer: BC Managed Care – PPO | Source: Ambulatory Visit | Attending: Interventional Radiology | Admitting: Interventional Radiology

## 2013-07-29 ENCOUNTER — Ambulatory Visit
Admission: RE | Admit: 2013-07-29 | Discharge: 2013-07-29 | Disposition: A | Discharge status: Home / Self Care | Payer: BC Managed Care – PPO | Source: Ambulatory Visit | Attending: Family | Admitting: Family

## 2013-07-29 DIAGNOSIS — E041 Nontoxic single thyroid nodule: Secondary | ICD-10-CM

## 2013-07-29 DIAGNOSIS — E049 Nontoxic goiter, unspecified: Secondary | ICD-10-CM | POA: Insufficient documentation

## 2013-07-30 ENCOUNTER — Telehealth: Payer: Self-pay | Admitting: Family

## 2013-07-30 DIAGNOSIS — E049 Nontoxic goiter, unspecified: Secondary | ICD-10-CM

## 2013-07-30 NOTE — Telephone Encounter (Signed)
Reviewed cytology from thyroid biopsy- consistent with non-neoplastic goiter. Spoke with pt, reviewed these results. Will plan referral to endo. She is agreeable. She reports ongoing mild sore throat. On zyrtec, drainage at baseline. Does have AM hoarseness and some reflux symptoms. Has upcoming apt at our office. Will plan trial of prilosec otc 20mg  once daily- pt aware.

## 2013-08-06 ENCOUNTER — Ambulatory Visit (INDEPENDENT_AMBULATORY_CARE_PROVIDER_SITE_OTHER): Payer: BC Managed Care – PPO | Admitting: Family

## 2013-08-06 ENCOUNTER — Encounter: Payer: Self-pay | Admitting: Family

## 2013-08-06 VITALS — BP 98/70 | HR 86 | Temp 98.0°F | Resp 16 | Ht 64.0 in | Wt 141.1 lb

## 2013-08-06 DIAGNOSIS — Z23 Encounter for immunization: Secondary | ICD-10-CM

## 2013-08-06 DIAGNOSIS — Z0000 Encounter for general adult medical examination without abnormal findings: Secondary | ICD-10-CM | POA: Insufficient documentation

## 2013-08-06 LAB — URINALYSIS, ROUTINE W REFLEX MICROSCOPIC
Nitrite: NEGATIVE
Protein, ur: NEGATIVE mg/dL
Urobilinogen, UA: 0.2 mg/dL (ref 0.0–1.0)

## 2013-08-06 NOTE — Progress Notes (Signed)
Subjective:    Patient ID: Lindsay [name], female    DOB: 03-Dec-1960, 52 y.o.   MRN: [id number]  HPI  Patient presents today for complete physical.  Immunizations: tetanus is up to date, she would like a flu shot today Diet: reports diet could be better, working on it Exercise: reports regular exercise, though august was a bad month for exercise.  Colonoscopy: up to date- due 2018 Dexa: up to date- was told osteopenia but stable.  Pap Smear: up to date Mammogram:up to date   Review of Systems  Constitutional: Negative for unexpected weight change.  HENT: Negative for congestion.        Some hoarseness, + snoring per husband  Respiratory: Negative for cough.   Cardiovascular: Negative for leg swelling.  Gastrointestinal: Negative for nausea, vomiting and diarrhea.  Genitourinary: Negative for dysuria and frequency.       Had period 7/4- light first one in 6 months  Musculoskeletal: Negative for myalgias and arthralgias.  Skin: Negative for rash.  Neurological: Negative for headaches.  Hematological:       Reports mild swelling of LN on the right  Psychiatric/Behavioral:       Denies depression   Past Medical History  Diagnosis Date  . Hyperlipidemia   . Acute myocardial infarction     normal coronary arteries  . Hypertension   . Arthritis   . History of chicken pox   . Depression   . Allergy   . Migraines   . History of blood clots     Thought to be due to Ortho Evra patch  . History of heart attack 2003    thought to be due to hormone replacement therapy  . Arrhythmia   . History of colon polyps   . Osteopenia   . Basal cell carcinoma   . Clotting disorder 08/06/2011  . Osteoarthritis 08/06/2011    History   Social History  . Marital Status: Legally Separated    Spouse Name: N/A    Number of Children: 1  . Years of Education: N/A   Occupational History  . Not on file.   Social History Main Topics  . Smoking status: Never Smoker   . Smokeless tobacco:  Never Used  . Alcohol Use: 1 - 1.5 oz/week    2-3 drink(s) per week     Comment: weekly  . Drug Use: No  . Sexual Activity: Not on file   Other Topics Concern  . Not on file   Social History Narrative   Regular exercise:  Yes. At least 2 x weekly   Caffeine Use:  2 cups coffee daily.   [employer]- [employer]   Married- she has a 75 year old daughter (lives with her father)                Past Surgical History  Procedure Laterality Date  . Nasal sinus surgery      Dr. Anner Crete  . Cardiac catheterization  08/30/2002    Dr. [place]  . Dilation and curettage of uterus  1978, 1998  . Bunionectomy Bilateral 2008 and 2012  . Rotator cuff repair  2010  . Fracture surgery  01/2005    left arm    Family History  Problem Relation Age of Onset  . Cardiomyopathy Mother   . Stroke Mother   . Hypertension Mother   . Hyperlipidemia Father   . Heart disease Father   . Arthritis Paternal Grandmother   . Kidney disease Paternal Grandmother  Allergies  Allergen Reactions  . Penicillins     rash  . Sulfa Antibiotics     Current Outpatient Prescriptions on File Prior to Visit  Medication Sig Dispense Refill  . Biotin 5000 MCG TABS Take by mouth.        . Calcium 500 MG CHEW Chew 2 each by mouth daily.      . cetirizine (ZYRTEC) 10 MG tablet Take 10 mg by mouth daily.        . Cholecalciferol (VITAMIN D3) 2000 UNITS TABS Take by mouth.        . Garlic TABS Take 1 tablet by mouth daily. GARLIC COMPLEX      . Krill Oil 500 MG CAPS Take 1 capsule by mouth 2 (two) times daily.      Marland Kitchen MAGNESIUM CITRATE PO Take 400 Units by mouth 2 (two) times daily.       . Multiple Vitamin (MULTIVITAMIN) tablet Take 1 tablet by mouth daily.        . NON FORMULARY Take 1 tablet by mouth 2 (two) times daily. NATURE'S BOUNTY COMPLETE Menopause support complex       . omeprazole (PRILOSEC OTC) 20 MG tablet Take 20 mg by mouth daily.      Marland Kitchen OVER THE COUNTER MEDICATION Take 450 mg by mouth 2 (two) times  daily. Holy basil      . verapamil (CALAN-SR) 240 MG CR tablet Take 1 tablet (240 mg total) by mouth daily.  90 tablet  3  . vitamin E 400 UNIT capsule Take 400 Units by mouth daily.         No current facility-administered medications on file prior to visit.    BP 98/70  Pulse 86  Temp(Src) 98 F (36.7 C) (Oral)  Resp 16  Ht 5\' 4"  (1.626 m)  Wt 141 lb 1.3 oz (63.993 kg)  BMI 24.2 kg/m2  SpO2 98%  LMP 06/04/2013       Objective:   Physical Exam  Physical Exam  Constitutional: She is oriented to person, place, and time. She appears well-developed and well-nourished. No distress.  HENT:  Head: Normocephalic and atraumatic.  Right Ear: Tympanic membrane and ear canal normal.  Left Ear: Tympanic membrane and ear canal normal.  Mouth/Throat: Oropharynx is clear and moist.  Eyes: Pupils are equal, round, and reactive to light. No scleral icterus.  Neck: Normal range of motion. No thyromegaly present.  Cardiovascular: Normal rate and regular rhythm.   No murmur heard. Pulmonary/Chest: Effort normal and breath sounds normal. No respiratory distress. He has no wheezes. She has no rales. She exhibits no tenderness.  Abdominal: Soft. Bowel sounds are normal. He exhibits no distension and no mass. There is no tenderness. There is no rebound and no guarding.  Musculoskeletal: She exhibits no edema.  Lymphadenopathy:    She has no cervical adenopathy.  Neurological: She is alert and oriented to person, place, and time. She exhibits normal muscle tone. Coordination normal.  Skin: Skin is warm and dry.  Psychiatric: She has a normal mood and affect. Her behavior is normal. Judgment and thought content normal.          Assessment & Plan:          Assessment & Plan:

## 2013-08-06 NOTE — Assessment & Plan Note (Signed)
Continue healthy diet, exercise.  Reviewed labs from her wellness fair at work.  Flu shot today.

## 2013-08-06 NOTE — Patient Instructions (Addendum)
Please follow up in 6 months, sooner if problems or concerns.

## 2013-08-07 LAB — URINALYSIS, MICROSCOPIC ONLY

## 2013-08-08 ENCOUNTER — Encounter: Payer: Self-pay | Admitting: Family

## 2013-08-10 ENCOUNTER — Ambulatory Visit (INDEPENDENT_AMBULATORY_CARE_PROVIDER_SITE_OTHER): Payer: BC Managed Care – PPO | Admitting: Endocrinology

## 2013-08-10 ENCOUNTER — Encounter: Payer: Self-pay | Admitting: Endocrinology

## 2013-08-10 VITALS — BP 130/74 | HR 77 | Ht 64.0 in | Wt 141.0 lb

## 2013-08-10 DIAGNOSIS — E041 Nontoxic single thyroid nodule: Secondary | ICD-10-CM

## 2013-08-10 NOTE — Patient Instructions (Addendum)
You should have the ultrasound rechecked in 6-12 months.  Either i or Efraim Kaufmann o'sullivan would be happy to do this.  If no change, you can recheck 1-2 years later. I would be happy to see you back here if there is significant enlargement of the nodule.

## 2013-08-10 NOTE — Progress Notes (Signed)
Subjective:    Patient ID: Lindsay [name], female    DOB: 1961-01-05, 52 y.o.   MRN: [id number]  HPI 1 month ago, pt was seen for a sore throat.  She was noted to have slight swelling at the anterior neck, but no assoc pain.   Past Medical History  Diagnosis Date  . Hyperlipidemia   . Acute myocardial infarction     normal coronary arteries  . Hypertension   . Arthritis   . History of chicken pox   . Depression   . Allergy   . Migraines   . History of blood clots     Thought to be due to Ortho Evra patch  . History of heart attack 2003    thought to be due to hormone replacement therapy  . Arrhythmia   . History of colon polyps   . Osteopenia   . Basal cell carcinoma   . Clotting disorder 08/06/2011  . Osteoarthritis 08/06/2011    Past Surgical History  Procedure Laterality Date  . Nasal sinus surgery      Dr. Anner Crete  . Cardiac catheterization  08/30/2002    Dr. [place]  . Dilation and curettage of uterus  1978, 1998  . Bunionectomy Bilateral 2008 and 2012  . Rotator cuff repair  2010  . Fracture surgery  01/2005    left arm    History   Social History  . Marital Status: Legally Separated    Spouse Name: N/A    Number of Children: 1  . Years of Education: N/A   Occupational History  . Not on file.   Social History Main Topics  . Smoking status: Never Smoker   . Smokeless tobacco: Never Used  . Alcohol Use: 1 - 1.5 oz/week    2-3 drink(s) per week     Comment: weekly  . Drug Use: No  . Sexual Activity: Not on file   Other Topics Concern  . Not on file   Social History Narrative   Regular exercise:  Yes. At least 2 x weekly   Caffeine Use:  2 cups coffee daily.   [employer] operations- [employer]   Married- she has a 32 year old daughter (lives with her father)                Current Outpatient Prescriptions on File Prior to Visit  Medication Sig Dispense Refill  . Biotin 5000 MCG TABS Take by mouth.        . Calcium 500 MG CHEW Chew 2 each by mouth daily.       . cetirizine (ZYRTEC) 10 MG tablet Take 10 mg by mouth daily.        . Cholecalciferol (VITAMIN D3) 2000 UNITS TABS Take by mouth.        . folic acid (FOLVITE) 800 MCG tablet Take 800 mcg by mouth daily.      . Garlic TABS Take 1 tablet by mouth daily. GARLIC COMPLEX      . Krill Oil 500 MG CAPS Take 1 capsule by mouth 2 (two) times daily.      Marland Kitchen MAGNESIUM CITRATE PO Take 400 Units by mouth 2 (two) times daily.       . Multiple Vitamin (MULTIVITAMIN) tablet Take 1 tablet by mouth daily.        . NON FORMULARY Take 1 tablet by mouth 2 (two) times daily. NATURE'S BOUNTY COMPLETE Menopause support complex       . omeprazole (PRILOSEC OTC) 20 MG  tablet Take 20 mg by mouth daily.      Marland Kitchen OVER THE COUNTER MEDICATION Take 450 mg by mouth 2 (two) times daily. Holy basil      . OVER THE COUNTER MEDICATION Take 2 tablets by mouth daily. [employer] Women's Ultra Mega Energy and Metabolism      . verapamil (CALAN-SR) 240 MG CR tablet Take 1 tablet (240 mg total) by mouth daily.  90 tablet  3  . vitamin E 400 UNIT capsule Take 400 Units by mouth daily.         No current facility-administered medications on file prior to visit.    Allergies  Allergen Reactions  . Penicillins     rash  . Sulfa Antibiotics     Family History  Problem Relation Age of Onset  . Cardiomyopathy Mother   . Stroke Mother   . Hypertension Mother   . Hyperlipidemia Father   . Heart disease Father   . Arthritis Paternal Grandmother   . Kidney disease Paternal Grandmother   no goiter or other thyroid probs.    BP 130/74  Pulse 77  Ht 5\' 4"  (1.626 m)  Wt 141 lb (63.957 kg)  BMI 24.19 kg/m2  SpO2 98%  LMP 06/04/2013  Review of Systems She reports brittle nails.  She has acral numbness in cold weather.      Objective:   Physical Exam VS: see vs page GEN: no distress NECK: there is a fullness at teh left upper pole, but i do not appreciate a discrete nodule. SKIN:  Normal texture and temperature.  Not  diaphoretic NODES:  None palpable at the neck PSYCH: alert, oriented x3.  Does not appear anxious nor depressed.   (i reviewed [place] and cytol reports). Lab Results  Component Value Date   TSH 1.444 07/21/2013      Assessment & Plan:  Thyroid nodule: bx result is low-risk, so this can be followed Acral numbness, not thyroid-related.

## 2013-09-15 ENCOUNTER — Ambulatory Visit (INDEPENDENT_AMBULATORY_CARE_PROVIDER_SITE_OTHER): Payer: BC Managed Care – PPO | Admitting: Physician Assistant

## 2013-09-15 ENCOUNTER — Encounter: Payer: Self-pay | Admitting: Physician Assistant

## 2013-09-15 VITALS — BP 110/74 | HR 72 | Temp 98.1°F | Resp 14 | Ht 64.0 in | Wt 143.5 lb

## 2013-09-15 DIAGNOSIS — K649 Unspecified hemorrhoids: Secondary | ICD-10-CM | POA: Insufficient documentation

## 2013-09-15 MED ORDER — HYDROCORTISONE ACE-PRAMOXINE 1-1 % RE FOAM: 1.0000 | Freq: Two times a day (BID) | RECTAL | Status: DC

## 2013-09-15 NOTE — Patient Instructions (Signed)
Increase fiber intake.  Daily sitz baths.  Tucks Pads for cleansing and itching.  Use Proctofoam as prescribed.  Return to clinic if symptoms are persisting.    Hemorrhoids Hemorrhoids are swollen veins around the rectum or anus. There are two types of hemorrhoids:   Internal hemorrhoids. These occur in the veins just inside the rectum. They may poke through to the outside and become irritated and painful.  External hemorrhoids. These occur in the veins outside the anus and can be felt as a painful swelling or hard lump near the anus. CAUSES  Pregnancy.   Obesity.   Constipation or diarrhea.   Straining to have a bowel movement.   Sitting for long periods on the toilet.  Heavy lifting or other activity that caused you to strain.  Anal intercourse. SYMPTOMS   Pain.   Anal itching or irritation.   Rectal bleeding.   Fecal leakage.   Anal swelling.   One or more lumps around the anus.  DIAGNOSIS  Your caregiver may be able to diagnose hemorrhoids by visual examination. Other examinations or tests that may be performed include:   Examination of the rectal area with a gloved hand (digital rectal exam).   Examination of anal canal using a small tube (scope).   A blood test if you have lost a significant amount of blood.  A test to look inside the colon (sigmoidoscopy or colonoscopy). TREATMENT Most hemorrhoids can be treated at home. However, if symptoms do not seem to be getting better or if you have a lot of rectal bleeding, your caregiver may perform a procedure to help make the hemorrhoids get smaller or remove them completely. Possible treatments include:   Placing a rubber band at the base of the hemorrhoid to cut off the circulation (rubber band ligation).   Injecting a chemical to shrink the hemorrhoid (sclerotherapy).   Using a tool to burn the hemorrhoid (infrared light therapy).   Surgically removing the hemorrhoid (hemorrhoidectomy).    Stapling the hemorrhoid to block blood flow to the tissue (hemorrhoid stapling).  HOME CARE INSTRUCTIONS   Eat foods with fiber, such as whole grains, beans, nuts, fruits, and vegetables. Ask your doctor about taking products with added fiber in them (fibersupplements).  Increase fluid intake. Drink enough water and fluids to keep your urine clear or pale yellow.   Exercise regularly.   Go to the bathroom when you have the urge to have a bowel movement. Do not wait.   Avoid straining to have bowel movements.   Keep the anal area dry and clean. Use wet toilet paper or moist towelettes after a bowel movement.   Medicated creams and suppositories may be used or applied as directed.   Only take over-the-counter or prescription medicines as directed by your caregiver.   Take warm sitz baths for 15 20 minutes, 3 4 times a day to ease pain and discomfort.   Place ice packs on the hemorrhoids if they are tender and swollen. Using ice packs between sitz baths may be helpful.   Put ice in a plastic bag.   Place a towel between your skin and the bag.   Leave the ice on for 15 20 minutes, 3 4 times a day.   Do not use a donut-shaped pillow or sit on the toilet for long periods. This increases blood pooling and pain.  SEEK MEDICAL CARE IF:  You have increasing pain and swelling that is not controlled by treatment or medicine.  You have  uncontrolled bleeding.  You have difficulty or you are unable to have a bowel movement.  You have pain or inflammation outside the area of the hemorrhoids. MAKE SURE YOU:  Understand these instructions.  Will watch your condition.  Will get help right away if you are not doing well or get worse. Document Released: 11/15/2000 Document Revised: 11/04/2012 Document Reviewed: 09/22/2012 [facility] Patient Information 2014 [employer].

## 2013-09-15 NOTE — Progress Notes (Signed)
Patient ID: Lindsay [name], female   DOB: [birth year], 52 y.o.   MRN: [id number]  Patient presents to clinic today c/o of hemorrhoid that has been present over the past few weeks.  Patient endorses history of hemorrhoids int he past and states this feels the same.  States hemorrhoid is on the outside.  Denies pain or bleeding, but endorses itching.  Patient endorses poor diet over the past few weeks and endorses constipation.  Has tired some preparation H with mild relief of symptoms.    Past Medical History  Diagnosis Date  . Hyperlipidemia   . Acute myocardial infarction     normal coronary arteries  . Hypertension   . Arthritis   . History of chicken pox   . Depression   . Allergy   . Migraines   . History of blood clots     Thought to be due to Ortho Evra patch  . History of heart attack 2003    thought to be due to hormone replacement therapy  . Arrhythmia   . History of colon polyps   . Osteopenia   . Basal cell carcinoma   . Clotting disorder 08/06/2011  . Osteoarthritis 08/06/2011    Current Outpatient Prescriptions on File Prior to Visit  Medication Sig Dispense Refill  . Biotin 5000 MCG TABS Take by mouth.        . Calcium 500 MG CHEW Chew 2 each by mouth daily.      . cetirizine (ZYRTEC) 10 MG tablet Take 10 mg by mouth daily.        . Cholecalciferol (VITAMIN D3) 2000 UNITS TABS Take by mouth.        . folic acid (FOLVITE) 800 MCG tablet Take 800 mcg by mouth daily.      . Garlic TABS Take 1 tablet by mouth daily. GARLIC COMPLEX      . Krill Oil 500 MG CAPS Take 1 capsule by mouth 2 (two) times daily.      Marland Kitchen MAGNESIUM CITRATE PO Take 400 Units by mouth 2 (two) times daily.       . NON FORMULARY Take 1 tablet by mouth 2 (two) times daily. NATURE'S BOUNTY COMPLETE Menopause support complex       . OVER THE COUNTER MEDICATION Take 450 mg by mouth 2 (two) times daily. Holy basil      . OVER THE COUNTER MEDICATION Take 2 tablets by mouth daily. [employer] Women's Ultra Mega Energy  and Metabolism      . verapamil (CALAN-SR) 240 MG CR tablet Take 1 tablet (240 mg total) by mouth daily.  90 tablet  3  . vitamin E 400 UNIT capsule Take 400 Units by mouth daily.         No current facility-administered medications on file prior to visit.    Allergies  Allergen Reactions  . Penicillins     rash  . Sulfa Antibiotics     Family History  Problem Relation Age of Onset  . Cardiomyopathy Mother   . Stroke Mother   . Hypertension Mother   . Hyperlipidemia Father   . Heart disease Father   . Arthritis Paternal Grandmother   . Kidney disease Paternal Grandmother     History   Social History  . Marital Status: Legally Separated    Spouse Name: N/A    Number of Children: 1  . Years of Education: N/A   Social History Main Topics  . Smoking status: Never Smoker   .  Smokeless tobacco: Never Used  . Alcohol Use: 1 - 1.5 oz/week    2-3 drink(s) per week     Comment: weekly  . Drug Use: No  . Sexual Activity: None   Other Topics Concern  . None   Social History Narrative   Regular exercise:  Yes. At least 2 x weekly   Caffeine Use:  2 cups coffee daily.   [employer] operations- [employer]   Married- she has a 34 year old daughter (lives with her father)               ROS See HPI.  All other ROS are negative   Filed Vitals:   09/15/13 1616  BP: 110/74  Pulse: 72  Temp: 98.1 F (36.7 C)  Resp: 14   Physical Exam  Vitals reviewed. Constitutional: She is oriented to person, place, and time and well-developed, well-nourished, and in no distress.  HENT:  Head: Normocephalic and atraumatic.  Eyes: Conjunctivae are normal.  Neck: Neck supple.  Genitourinary: Rectal exam shows external hemorrhoid. Rectal exam shows no internal hemorrhoid, no fissure, no laceration and no tenderness.  Neurological: She is alert and oriented to person, place, and time.  Skin: Skin is warm and dry. No rash noted.   Recent Results (from the past 2160 hour(s))  STREP A DNA PROBE      Status: None   Collection Time    07/21/13  9:30 AM      Result Value Range   GASP NEGATIVE    TSH     Status: None   Collection Time    07/21/13  9:32 AM      Result Value Range   TSH 1.444  0.350 - 4.500 uIU/mL  T3, FREE     Status: None   Collection Time    07/21/13  9:32 AM      Result Value Range   T3, Free 2.9  2.3 - 4.2 pg/mL  T4, FREE     Status: None   Collection Time    07/21/13  9:32 AM      Result Value Range   Free T4 1.39  0.80 - 1.80 ng/dL  URINALYSIS, ROUTINE W REFLEX MICROSCOPIC     Status: Abnormal   Collection Time    08/06/13  8:32 AM      Result Value Range   Color, Urine YELLOW  YELLOW   APPearance CLEAR  CLEAR   Specific Gravity, Urine 1.025  1.005 - 1.030   pH 6.0  5.0 - 8.0   Glucose, UA NEG  NEG mg/dL   Bilirubin Urine SMALL (*) NEG   Ketones, ur TRACE (*) NEG mg/dL   Hgb urine dipstick NEG  NEG   Protein, ur NEG  NEG mg/dL   Urobilinogen, UA 0.2  0.0 - 1.0 mg/dL   Nitrite NEG  NEG   Leukocytes, UA SMALL (*) NEG  URINALYSIS, MICROSCOPIC ONLY     Status: Abnormal   Collection Time    08/06/13  8:32 AM      Result Value Range   Squamous Epithelial / LPF RARE  RARE   Crystals NONE SEEN  NONE SEEN   Casts NONE SEEN  NONE SEEN   WBC, UA 3-6 (*) <3 WBC/hpf   RBC / HPF 0-2  <3 RBC/hpf   Bacteria, UA NONE SEEN  RARE    Assessment/Plan: Hemorrhoid Rx Proctofoam HC.  Encourage fiber supplementation and probiotic daily.  Sitz Bath.  Tucks pads [facility]).  If  symptoms are not improving may need consult with colorectal surgeon.

## 2013-09-15 NOTE — Assessment & Plan Note (Signed)
Rx Proctofoam HC.  Encourage fiber supplementation and probiotic daily.  Sitz Bath.  Tucks pads [facility]).  If symptoms are not improving may need consult with colorectal surgeon.

## 2013-10-13 ENCOUNTER — Encounter: Payer: Self-pay | Admitting: Cardiology

## 2013-10-13 ENCOUNTER — Ambulatory Visit (INDEPENDENT_AMBULATORY_CARE_PROVIDER_SITE_OTHER): Payer: BC Managed Care – PPO | Admitting: Cardiology

## 2013-10-13 VITALS — BP 118/70 | HR 80 | Ht 64.0 in | Wt 141.0 lb

## 2013-10-13 DIAGNOSIS — I10 Essential (primary) hypertension: Secondary | ICD-10-CM

## 2013-10-13 DIAGNOSIS — I7300 Raynaud's syndrome without gangrene: Secondary | ICD-10-CM

## 2013-10-13 DIAGNOSIS — I252 Old myocardial infarction: Secondary | ICD-10-CM

## 2013-10-13 DIAGNOSIS — E7800 Pure hypercholesterolemia, unspecified: Secondary | ICD-10-CM

## 2013-10-13 LAB — LIPID PANEL
Cholesterol: 213 mg/dL — ABNORMAL HIGH (ref 0–200)
HDL: 76.5 mg/dL (ref >39.00)
Total CHOL/HDL Ratio: 3
Triglycerides: 67 mg/dL (ref 0.0–149.0)

## 2013-10-13 LAB — LDL CHOLESTEROL, DIRECT: Direct LDL: 125.7 mg/dL

## 2013-10-13 LAB — BASIC METABOLIC PANEL
Calcium: 9.5 mg/dL (ref 8.4–10.5)
Chloride: 104 mEq/L (ref 96–112)
Creatinine, Ser: 1.1 mg/dL (ref 0.4–1.2)
GFR: 57.37 mL/min — ABNORMAL LOW (ref >60.00)
Sodium: 140 mEq/L (ref 135–145)

## 2013-10-13 LAB — HEPATIC FUNCTION PANEL
AST: 38 U/L — ABNORMAL HIGH (ref 0–37)
Albumin: 4.2 g/dL (ref 3.5–5.2)
Alkaline Phosphatase: 42 U/L (ref 39–117)
Total Bilirubin: 0.9 mg/dL (ref 0.3–1.2)
Total Protein: 7.2 g/dL (ref 6.0–8.3)

## 2013-10-13 MED ORDER — AMLODIPINE BESYLATE 5 MG PO TABS: 5.0000 mg | ORAL_TABLET | Freq: Every day | ORAL | Status: DC

## 2013-10-13 NOTE — Assessment & Plan Note (Signed)
Patient has a history of hypercholesterolemia.  We're checking lab work today.  She is not on any statin therapy.

## 2013-10-13 NOTE — Assessment & Plan Note (Signed)
She has been having problems with cold hands.  She is also been having problems with her fingernails.  Her hands tingle in cold weather.  She complains of her feet being cold all the time.  We are going to switch her from verapamil to amlodipine 5 mg one daily to see if this will help her symptoms of Raynaud's.

## 2013-10-13 NOTE — Progress Notes (Signed)
Quick Note:  Please report to patient. The recent labs are stable. Continue same medication and careful diet. The cholesterol and LDL are mildly elevated. Work harder on diet and exercise. Two of LFTs are slightly elevated. This may be from the verapamil which we have stopped.Kidney function is good. ______

## 2013-10-13 NOTE — Assessment & Plan Note (Signed)
Blood pressure is remaining stable on current therapy.  She has had some mild tendency toward constipation from the long-term verapamil.

## 2013-10-13 NOTE — Patient Instructions (Signed)
Your physician recommends that you return for lab work today: Lipid profile/Hepatic Panel/BMET  Your physician has recommended you make the following change in your medication:  1) Stop Verapamil 2) Start Amlodipine 5 mg daily  Your physician wants you to follow-up in: 6 months with Dr. Patty Sermons. You will receive a reminder letter in the mail two months in advance. If you don't receive a letter, please call our office to schedule the follow-up appointment.  Your physician recommends that you return for lab work in: 6 months at follow up with Dr. Patty Sermons. You must be fasting for this lab work.

## 2013-10-13 NOTE — Progress Notes (Signed)
Lindsay [name] Date of Birth:  [birth year] [address] [place], [place]  [address] [phone]  Fax   [phone]  HPI: This pleasant 52 year old woman is seen for a six-month followup office visit. She has a past history of hypercholesterolemia. At age 33 she had an acute myocardial infarction at that time that she was on birth control pills. She has an unusual prothrombin factor and has been evaluated by hematology at [facility]. They recommended long-term aspirin. She is also avoiding birth control pills and hormones. He said no subsequent problems with angina or myocardial infarction. She had a catheterization at the time of her myocardial infarction which showed normal coronary arteries and normal LV function with an ejection fraction of 70%. The patient has not been experiencing any exertional symptoms.  Since last visit she has begun to have some symptoms of Raynaud's phenomena.   Current Outpatient Prescriptions  Medication Sig Dispense Refill  . Biotin 5000 MCG TABS Take by mouth.        . Calcium 500 MG CHEW Chew 2 each by mouth daily.      . cetirizine (ZYRTEC) 10 MG tablet Take 10 mg by mouth daily.        . Cholecalciferol (VITAMIN D3) 2000 UNITS TABS Take by mouth.        . folic acid (FOLVITE) 800 MCG tablet Take 800 mcg by mouth daily.      . Garlic TABS Take 1 tablet by mouth daily. GARLIC COMPLEX      . Krill Oil 500 MG CAPS Take 1 capsule by mouth 2 (two) times daily.      Marland Kitchen MAGNESIUM CITRATE PO Take 400 Units by mouth 2 (two) times daily.       . NON FORMULARY Take 1 tablet by mouth 2 (two) times daily. NATURE'S BOUNTY COMPLETE Menopause support complex       . OVER THE COUNTER MEDICATION Take 450 mg by mouth 2 (two) times daily. Holy basil      . OVER THE COUNTER MEDICATION Take 2 tablets by mouth daily. [employer] Women's Ultra Mega Energy and Metabolism      . vitamin E 400 UNIT capsule Take 400 Units by mouth daily.        Marland Kitchen amLODipine (NORVASC) 5 MG  tablet Take 1 tablet (5 mg total) by mouth daily.  30 tablet  3   No current facility-administered medications for this visit.    Allergies  Allergen Reactions  . Penicillins     rash  . Sulfa Antibiotics     Patient Active Problem List   Diagnosis Date Noted  . Raynaud phenomenon 10/13/2013  . Hemorrhoid 09/15/2013  . Routine general medical examination at a health care facility 08/06/2013  . Sinusitis 07/21/2013  . Thyroid nodule 07/21/2013  . HTN (hypertension) 07/31/2012  . General medical examination 08/06/2011  . Clotting disorder 08/06/2011  . Depression 08/06/2011  . Headache 08/06/2011  . Osteoarthritis 08/06/2011  . History of heart attack   . Hypercholesterolemia 07/08/2011    History  Smoking status  . Never Smoker   Smokeless tobacco  . Never Used    History  Alcohol Use  . 1 - 1.5 oz/week  . 2-3 drink(s) per week    Comment: weekly    Family History  Problem Relation Age of Onset  . Cardiomyopathy Mother   . Stroke Mother   . Hypertension Mother   . Hyperlipidemia Father   . Heart disease Father   .  Arthritis Paternal Grandmother   . Kidney disease Paternal Grandmother     Review of Systems: The patient denies any heat or cold intolerance.  No weight gain or weight loss.  The patient denies headaches or blurry vision.  There is no cough or sputum production.  The patient denies dizziness.  There is no hematuria or hematochezia.  The patient denies any muscle aches or arthritis.  The patient denies any rash.  The patient denies frequent falling or instability.  There is no history of depression or anxiety.  All other systems were reviewed and are negative.   Physical Exam: Filed Vitals:   10/13/13 0914  BP: 118/70  Pulse: 80   the general appearance reveals a well-developed well-nourished woman in no distress.The head and neck exam reveals pupils equal and reactive.  Extraocular movements are full.  There is no scleral icterus.  The mouth  and pharynx are normal.  The neck is supple.  The carotids reveal no bruits.  The jugular venous pressure is normal.  The  thyroid is not enlarged.  There is no lymphadenopathy.  The chest is clear to percussion and auscultation.  There are no rales or rhonchi.  Expansion of the chest is symmetrical.  The precordium is quiet.  The first heart sound is normal.  The second heart sound is physiologically split.  There is no murmur gallop rub or click.  There is no abnormal lift or heave.  The abdomen is soft and nontender.  The bowel sounds are normal.  The liver and spleen are not enlarged.  There are no abdominal masses.  There are no abdominal bruits.  Extremities reveal good pedal pulses.  There is no phlebitis or edema.  There is no cyanosis or clubbing.  Strength is normal and symmetrical in all extremities.  There is no lateralizing weakness.  There are no sensory deficits.  The skin is warm and dry.  There is no rash.      Assessment / Plan: Continue on same medication.  Recheck in 6 months for followup office visit and fasting lipid panel hepatic function panel and basal metabolic panel and EKG.

## 2014-02-04 ENCOUNTER — Other Ambulatory Visit: Payer: Self-pay

## 2014-02-04 ENCOUNTER — Ambulatory Visit: Payer: BC Managed Care – PPO | Admitting: Family

## 2014-02-04 MED ORDER — AMLODIPINE BESYLATE 5 MG PO TABS
5.0000 mg | ORAL_TABLET | Freq: Every day | ORAL | Status: DC
Start: 2014-02-04 — End: 2014-02-09

## 2014-02-09 ENCOUNTER — Other Ambulatory Visit: Payer: Self-pay

## 2014-02-09 MED ORDER — AMLODIPINE BESYLATE 5 MG PO TABS: 5.0000 mg | ORAL_TABLET | Freq: Every day | ORAL | Status: DC

## 2014-02-16 ENCOUNTER — Other Ambulatory Visit: Payer: Self-pay | Admitting: Obstetrics & Gynecology

## 2014-02-16 DIAGNOSIS — R928 Other abnormal and inconclusive findings on diagnostic imaging of breast: Secondary | ICD-10-CM

## 2014-02-25 ENCOUNTER — Ambulatory Visit
Admission: RE | Admit: 2014-02-25 | Discharge: 2014-02-25 | Disposition: A | Discharge status: Home / Self Care | Payer: BC Managed Care – PPO | Source: Ambulatory Visit | Attending: Obstetrics & Gynecology | Admitting: Obstetrics & Gynecology

## 2014-02-25 DIAGNOSIS — R928 Other abnormal and inconclusive findings on diagnostic imaging of breast: Secondary | ICD-10-CM

## 2014-04-12 ENCOUNTER — Encounter: Payer: Self-pay | Admitting: Cardiology

## 2014-04-12 ENCOUNTER — Ambulatory Visit (INDEPENDENT_AMBULATORY_CARE_PROVIDER_SITE_OTHER): Payer: BC Managed Care – PPO | Admitting: Cardiology

## 2014-04-12 ENCOUNTER — Other Ambulatory Visit: Payer: BC Managed Care – PPO

## 2014-04-12 VITALS — BP 124/66 | HR 70 | Ht 64.0 in | Wt 134.0 lb

## 2014-04-12 DIAGNOSIS — E039 Hypothyroidism, unspecified: Secondary | ICD-10-CM

## 2014-04-12 DIAGNOSIS — I252 Old myocardial infarction: Secondary | ICD-10-CM

## 2014-04-12 DIAGNOSIS — I214 Non-ST elevation (NSTEMI) myocardial infarction: Secondary | ICD-10-CM

## 2014-04-12 DIAGNOSIS — I10 Essential (primary) hypertension: Secondary | ICD-10-CM

## 2014-04-12 DIAGNOSIS — E7800 Pure hypercholesterolemia, unspecified: Secondary | ICD-10-CM

## 2014-04-12 DIAGNOSIS — I7300 Raynaud's syndrome without gangrene: Secondary | ICD-10-CM

## 2014-04-12 LAB — HEPATIC FUNCTION PANEL
ALK PHOS: 45 U/L (ref 39–117)
ALT: 22 U/L (ref 0–35)
AST: 29 U/L (ref 0–37)
Albumin: 4.3 g/dL (ref 3.5–5.2)
BILIRUBIN TOTAL: 1.1 mg/dL (ref 0.2–1.2)
Bilirubin, Direct: 0.1 mg/dL (ref 0.0–0.3)
Total Protein: 7.2 g/dL (ref 6.0–8.3)

## 2014-04-12 LAB — LIPID PANEL
Cholesterol: 178 mg/dL (ref 0–200)
HDL: 67 mg/dL (ref >39.00)
LDL Cholesterol: 101 mg/dL — ABNORMAL HIGH (ref 0–99)
Total CHOL/HDL Ratio: 3
Triglycerides: 52 mg/dL (ref 0.0–149.0)
VLDL: 10.4 mg/dL (ref 0.0–40.0)

## 2014-04-12 LAB — BASIC METABOLIC PANEL
BUN: 16 mg/dL (ref 6–23)
CHLORIDE: 104 meq/L (ref 96–112)
CO2: 26 mEq/L (ref 19–32)
Calcium: 9.4 mg/dL (ref 8.4–10.5)
Creatinine, Ser: 0.9 mg/dL (ref 0.4–1.2)
GFR: 72.89 mL/min (ref >60.00)
GLUCOSE: 79 mg/dL (ref 70–99)
Potassium: 3.8 mEq/L (ref 3.5–5.1)
Sodium: 139 mEq/L (ref 135–145)

## 2014-04-12 NOTE — Assessment & Plan Note (Signed)
Blood pressure was remaining normal on current therapy.  No chest pain dizziness or syncope

## 2014-04-12 NOTE — Assessment & Plan Note (Signed)
We are checking lab work today.  She is controlling her cholesterol with diet.  She is not on statin therapy

## 2014-04-12 NOTE — Progress Notes (Signed)
Festus Aloe Date of Birth:  1961/05/16 [facility] [address] [place], [place]  [address] [phone]        Fax   [phone]   History of Present Illness: This pleasant 53 year old woman is seen for a six-month followup office visit. She has a past history of hypercholesterolemia. At age 7 she had an acute myocardial infarction at that time that she was on birth control pills. She has an unusual prothrombin factor and has been evaluated by hematology at [facility]. They recommended long-term aspirin. She is also avoiding birth control pills and hormones. He said no subsequent problems with angina or myocardial infarction. She had a catheterization at the time of her myocardial infarction which showed normal coronary arteries and normal LV function with an ejection fraction of 70%. The patient has not been experiencing any exertional symptoms. Since last visit she has begun to have some symptoms of Raynaud's phenomena.  Her symptoms of Raynaud's have improved when we switched her from verapamil to amlodipine for her calcium channel blocker.  Since last visit she has been to a clinic in [place] where she was analyzed for food allergies.  She was found to be hypothyroid and found to be allergic to gluten and nuts and dairy products and eggs.  Since switching to her new diet she has lost 7 pounds and feels much better.  She is no longer waking up with painful blisters on her tongue.  Current Outpatient Prescriptions  Medication Sig Dispense Refill  . amLODipine (NORVASC) 5 MG tablet Take 1 tablet (5 mg total) by mouth daily.  90 tablet  1  . Biotin 5000 MCG TABS Take by mouth.        . Calcium 500 MG CHEW Chew 2 each by mouth daily.      . Calcium-Magnesium-Vitamin D (CALCIUM 500 PO) Take by mouth.      . cetirizine (ZYRTEC) 10 MG tablet Take 10 mg by mouth daily.        . Cholecalciferol (VITAMIN D3) 2000 UNITS TABS Take by mouth.        . folic acid  (FOLVITE) 400 MCG tablet Take 800 mcg by mouth daily.      . Garlic TABS Take 1 tablet by mouth daily. GARLIC COMPLEX      . Krill Oil 500 MG CAPS Take 1 capsule by mouth 2 (two) times daily.      Marland Kitchen MAGNESIUM CITRATE PO Take 400 Units by mouth 2 (two) times daily.       Marland Kitchen OVER THE COUNTER MEDICATION Take 450 mg by mouth 2 (two) times daily. Holy basil      . OVER THE COUNTER MEDICATION Take 2 tablets by mouth daily. [place] Women[employer] Ultra [employer]      . Saw Palmetto 80 MG CAPS Take by mouth.      . Thyroid (NATURE-THROID PO) Take by mouth daily.      . vitamin E 400 UNIT capsule Take 400 Units by mouth daily.         No current facility-administered medications for this visit.    Allergies  Allergen Reactions  . Penicillins     rash  . Sulfa Antibiotics     Patient Active Problem List   Diagnosis Date Noted  . Hypothyroid 04/12/2014  . Raynaud phenomenon 10/13/2013  . Hemorrhoid 09/15/2013  . Routine general medical examination at a health care facility 08/06/2013  . Sinusitis 07/21/2013  .  Thyroid nodule 07/21/2013  . HTN (hypertension) 07/31/2012  . General medical examination 08/06/2011  . Clotting disorder 08/06/2011  . Depression 08/06/2011  . Headache 08/06/2011  . Osteoarthritis 08/06/2011  . History of heart attack   . Hypercholesterolemia 07/08/2011    History  Smoking status  . Never Smoker   Smokeless tobacco  . Never Used    History  Alcohol Use  . 1.0 - 1.5 oz/week  . 2-3 drink(s) per week    Comment: weekly    Family History  Problem Relation Age of Onset  . Cardiomyopathy Mother   . Stroke Mother   . Hypertension Mother   . Hyperlipidemia Father   . Heart disease Father   . Arthritis Paternal Grandmother   . Kidney disease Paternal Grandmother     Review of Systems: Constitutional: no fever chills diaphoresis or fatigue or change in weight.  Head and neck: no hearing loss, no epistaxis, no photophobia or visual  disturbance. Respiratory: No cough, shortness of breath or wheezing. Cardiovascular: No chest pain peripheral edema, palpitations. Gastrointestinal: No abdominal distention, no abdominal pain, no change in bowel habits hematochezia or melena. Genitourinary: No dysuria, no frequency, no urgency, no nocturia. Musculoskeletal:No arthralgias, no back pain, no gait disturbance or myalgias. Neurological: No dizziness, no headaches, no numbness, no seizures, no syncope, no weakness, no tremors. Hematologic: No lymphadenopathy, no easy bruising. Psychiatric: No confusion, no hallucinations, no sleep disturbance.    Physical Exam: Filed Vitals:   04/12/14 0853  BP: 124/66  Pulse: 70   the general appearance reveals a well-developed well-nourished woman in no distress.The head and neck exam reveals pupils equal and reactive.  Extraocular movements are full.  There is no scleral icterus.  The mouth and pharynx are normal.  The neck is supple.  The carotids reveal no bruits.  The jugular venous pressure is normal.  The  thyroid is not enlarged.  There is no lymphadenopathy.  The chest is clear to percussion and auscultation.  There are no rales or rhonchi.  Expansion of the chest is symmetrical.  The precordium is quiet.  The first heart sound is normal.  The second heart sound is physiologically split.  There is no murmur gallop rub or click.  There is no abnormal lift or heave.  The abdomen is soft and nontender.  The bowel sounds are normal.  The liver and spleen are not enlarged.  There are no abdominal masses.  There are no abdominal bruits.  Extremities reveal good pedal pulses.  There is no phlebitis or edema.  There is no cyanosis or clubbing.  Strength is normal and symmetrical in all extremities.  There is no lateralizing weakness.  There are no sensory deficits.  The skin is warm and dry.  There is no rash.  EKG shows normal sinus rhythm and is within normal limits   Assessment / Plan: 1.  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 2. Hypercholesterolemia 3. multiple food allergies 4. Hypothyroidism 5.  Raynaud's phenomena 6. hypertension  Continue same medication.  Recheck in 6 months for office visit and fasting lab work.  Blood work today is pending

## 2014-04-12 NOTE — Progress Notes (Signed)
Quick Note:  Please report to patient. The recent labs are stable. Continue same medication and careful diet. Nice drop in her cholesterol since losing the weight on her new Diet. The LFTs have normalized also. ______

## 2014-04-12 NOTE — Patient Instructions (Signed)
Will obtain labs today and call you with the results (LP/BMET/HFP)  Your physician recommends that you continue on your current medications as directed. Please refer to the Current Medication list given to you today.  Your physician wants you to follow-up in: 6 months with fasting labs (lp/bmet/hfp)  You will receive a reminder letter in the mail two months in advance. If you don't receive a letter, please call our office to schedule the follow-up appointment.  

## 2014-04-12 NOTE — Assessment & Plan Note (Signed)
Raynaud's phenomena has improved since changing her diet and switching off verapamil and starting amlodipine

## 2014-04-13 ENCOUNTER — Telehealth: Payer: Self-pay | Admitting: *Deleted

## 2014-04-13 NOTE — Telephone Encounter (Signed)
Advised patient of lab results  

## 2014-04-13 NOTE — Telephone Encounter (Signed)
Message copied by Earvin Hansen on Wed Apr 13, 2014 10:57 AM ------      Message from: Darlin Coco      Created: Tue Apr 12, 2014  8:30 PM       Please report to patient.  The recent labs are stable. Continue same medication and careful diet. Nice drop in her cholesterol since losing the weight on her new  Diet.  The LFTs have normalized also. ------

## 2014-06-18 IMAGING — US US SOFT TISSUE HEAD/NECK
1 series · 14 of 25 positions shown · non-contrast
Comparison: None.

CLINICAL DATA: Right thyroid megaly by physical exam.

THYROID ULTRASOUND
TECHNIQUE: Ultrasound examination of the thyroid gland and adjacent
soft tissues was performed.

[Series 1: us soft tissue head/neck · 0.08mm/px · 14 of 33 slices shown]
[im 1/33]
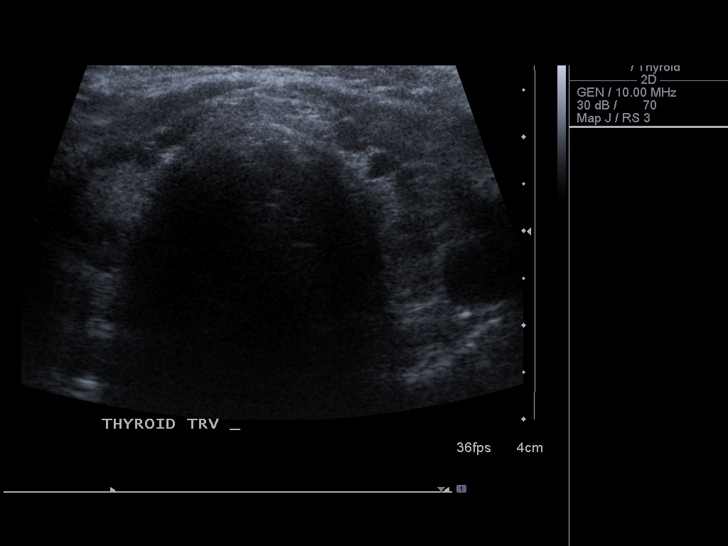
[im 3/33]
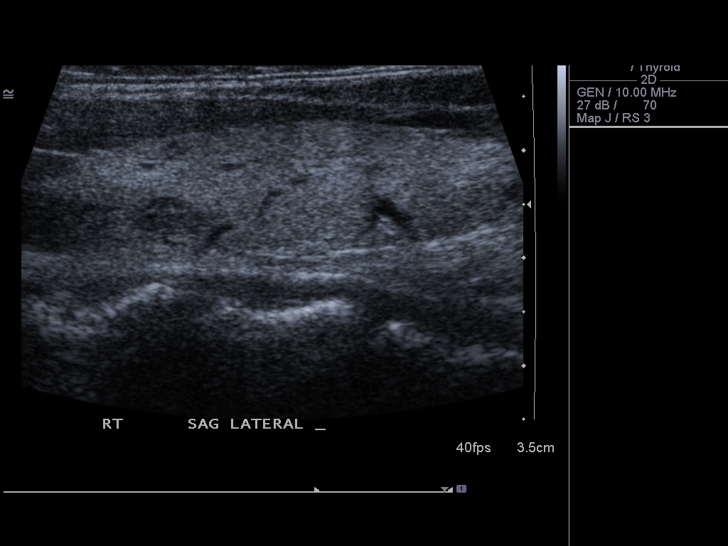
[im 6/33]
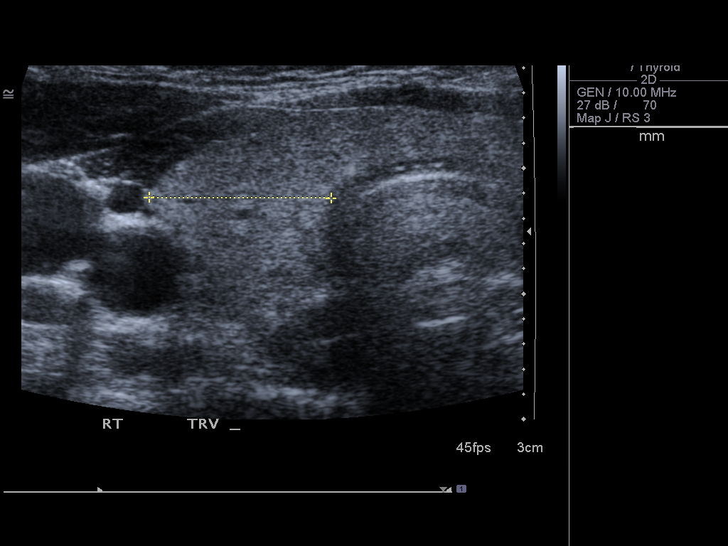
[im 9/33]
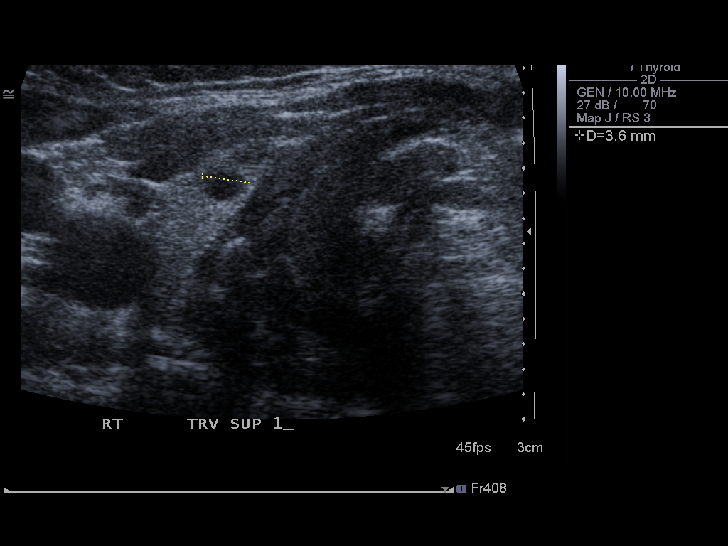
[im 11/33]
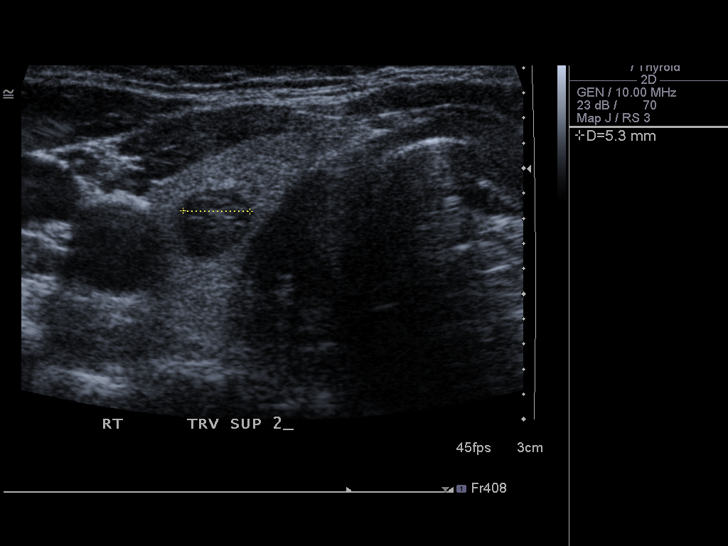
[im 13/33]
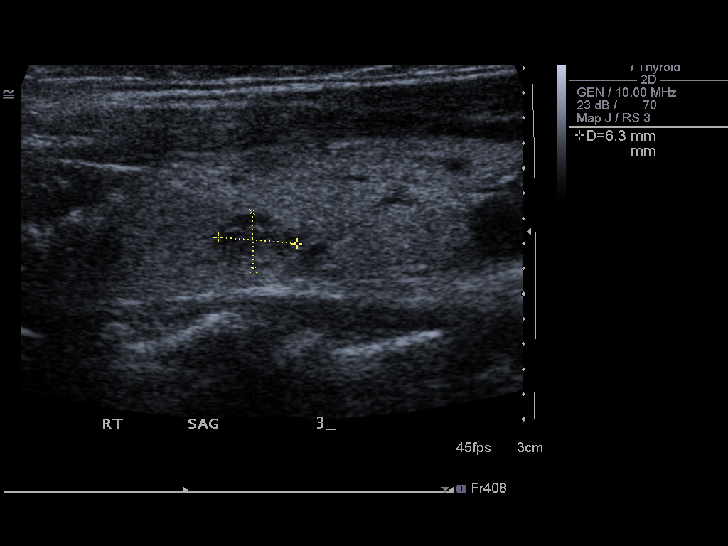
[im 15/33]
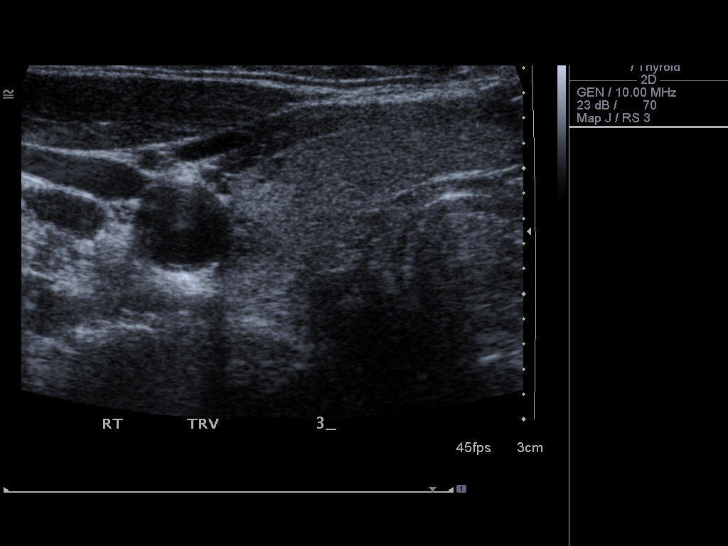
[im 18/33]
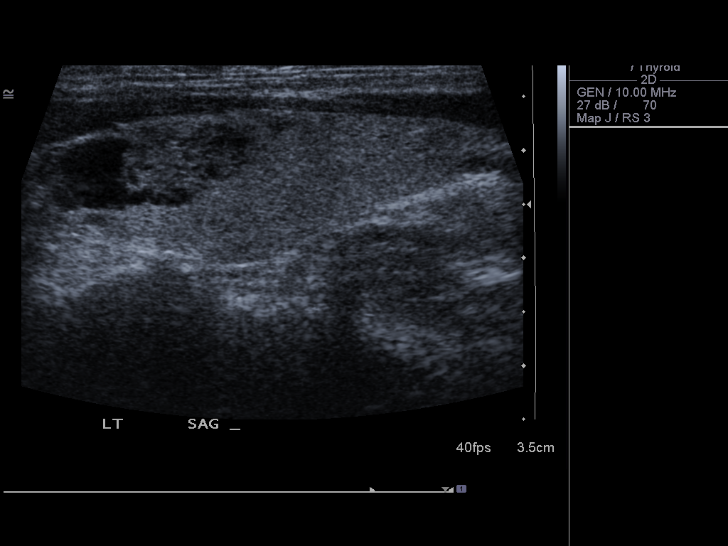
[im 21/33]
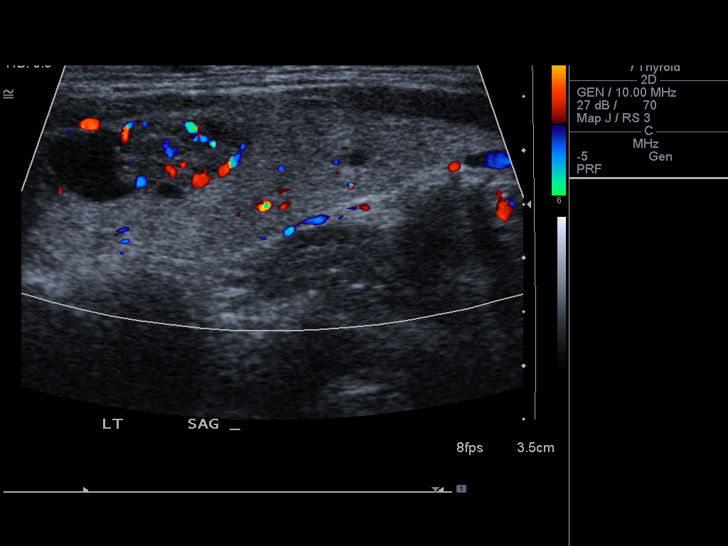
[im 22/33]
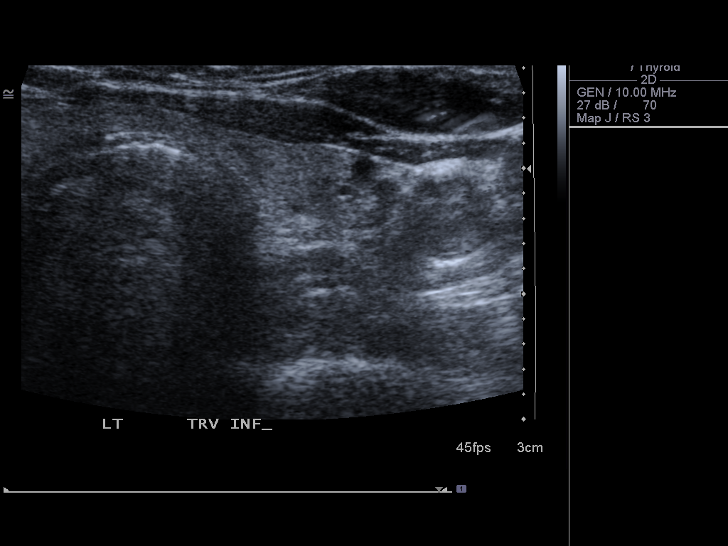
[im 25/33]
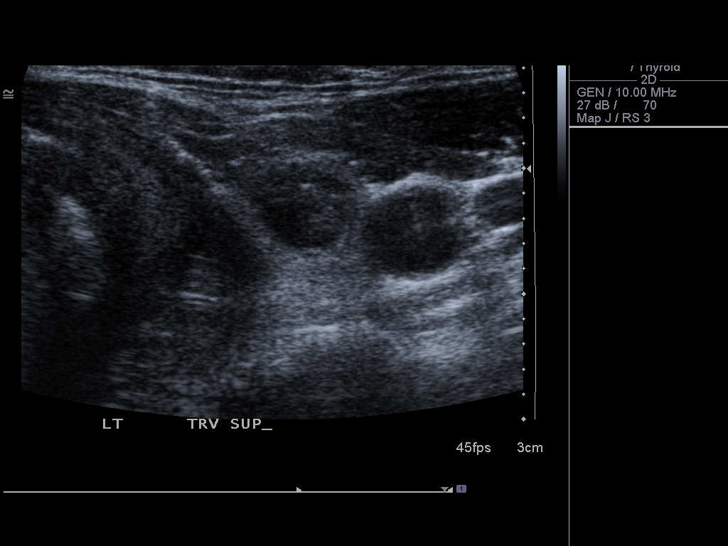
[im 27/33]
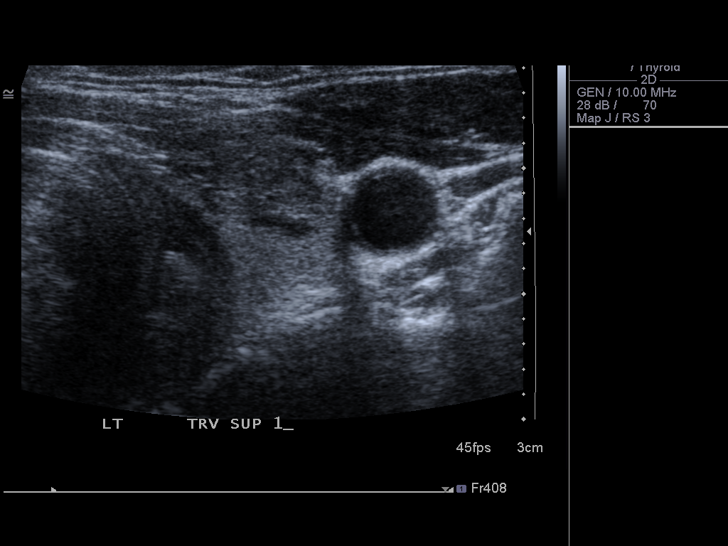
[im 30/33]
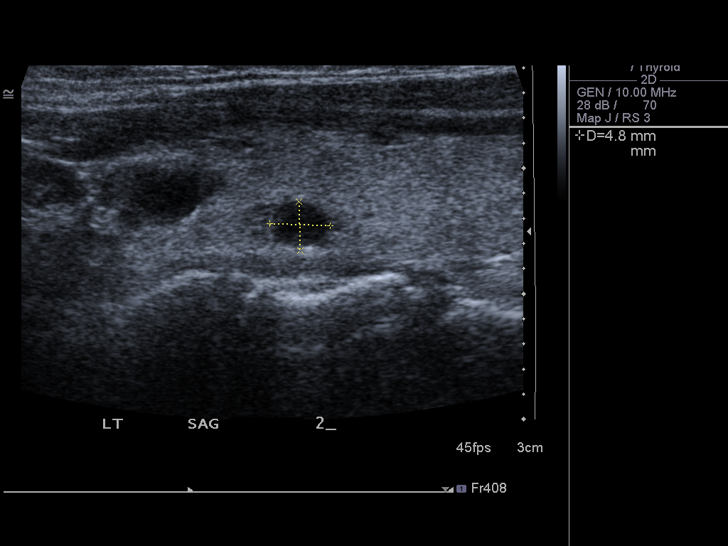
[im 33/33]
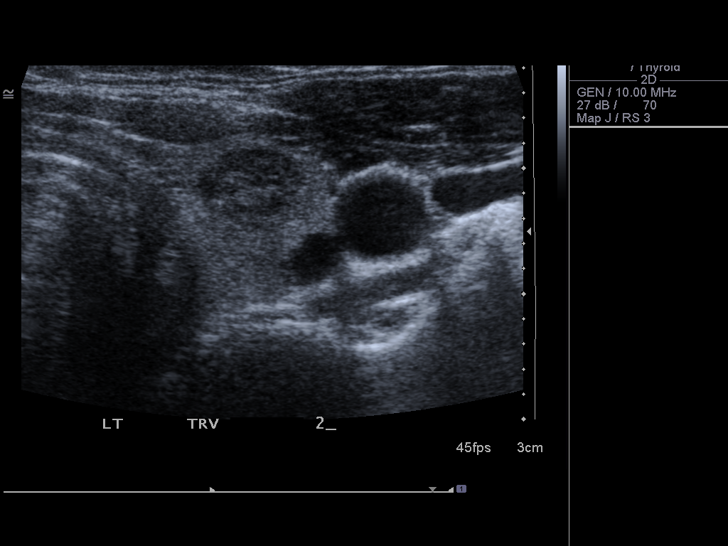

[14 of 25 positions shown; findings below may reference images not displayed]

FINDINGS: Right thyroid lobe:  5.2 x 1.6 x 1.5 cm.
Left thyroid lobe:  4.4 x 1.3 x 1.6 cm.
Isthmus:  3 mm.

Focal nodules:  Multiple bilateral complex and solid nodules are
present.  There is a dominant left upper pole complex nodule
measuring 1.9 x 1.0 x 0.8 cm.  It is predominately solid.  Sub
centimeter solid and complex nodules are seen bilaterally.
Glandular tissue is heterogeneous.

Lymphadenopathy:  None visualized.
IMPRESSION: 1.9 cm predominately solid nodule in the left upper pole.  This
meets criteria for fine needle aspiration.

## 2014-06-26 IMAGING — US US THYROID BIOPSY
1 series · 7 of 7 positions shown · non-contrast
Comparison: Thyroid ultrasound dated 07/21/2013.

CLINICAL DATA: Dominant left thyroid nodule measuring 1.9 cm in
greatest diameter.

ULTRASOUND GUIDED NEEDLE ASPIRATE BIOPSY OF THE THYROID GLAND

[Series 1: us thyroid biopsy · 0.05mm/px · 7 acquisitions, 7 frames shown]
[im 1/7]
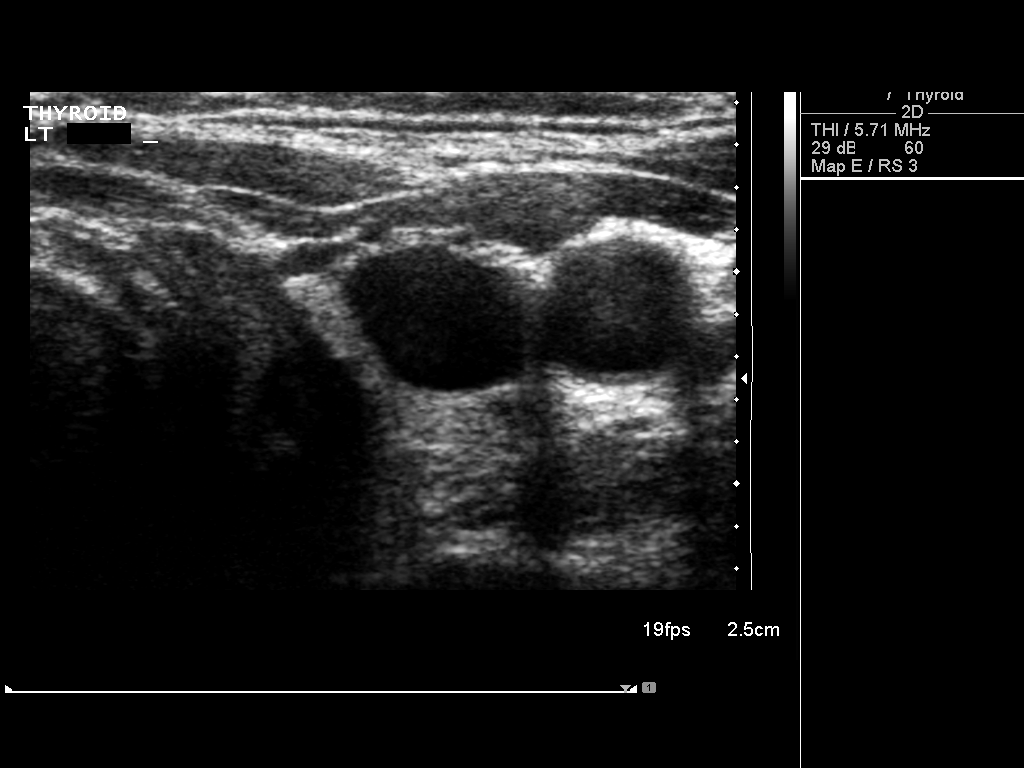
[im 2/7]
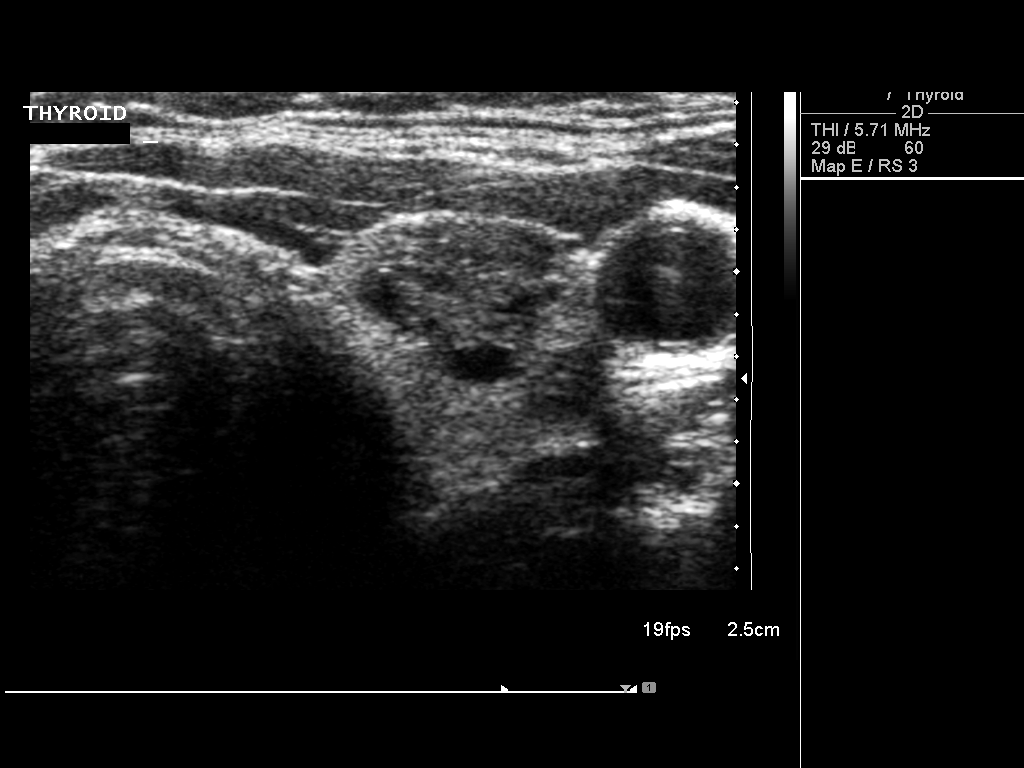
[im 3/7]
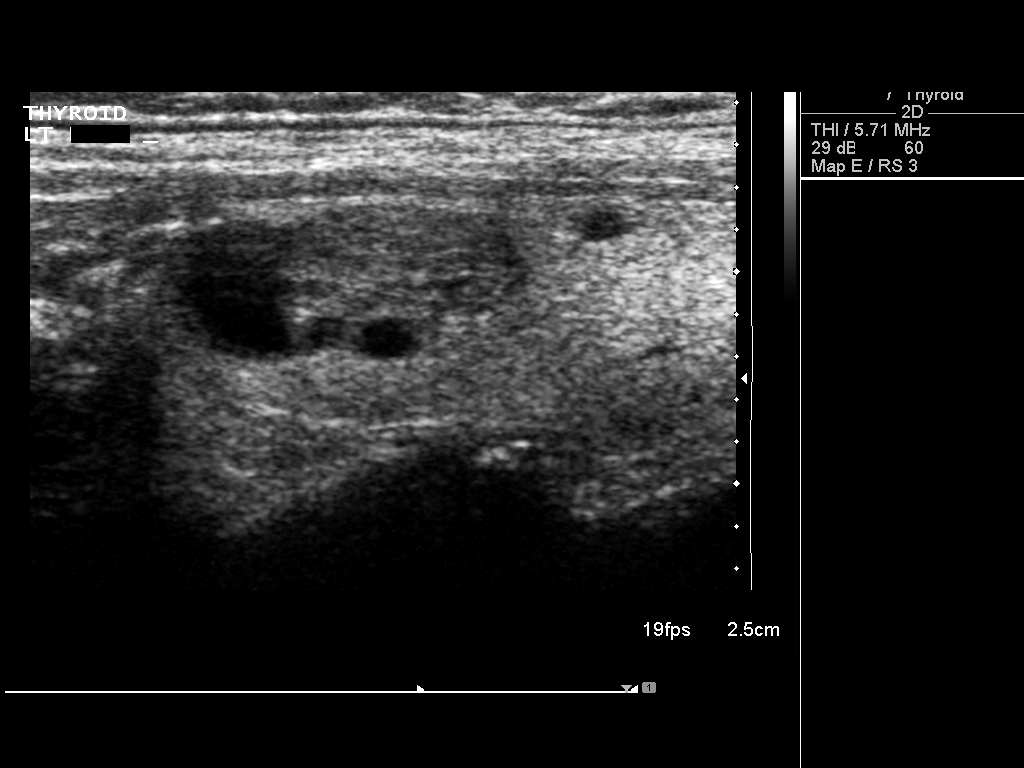
[im 4/7]
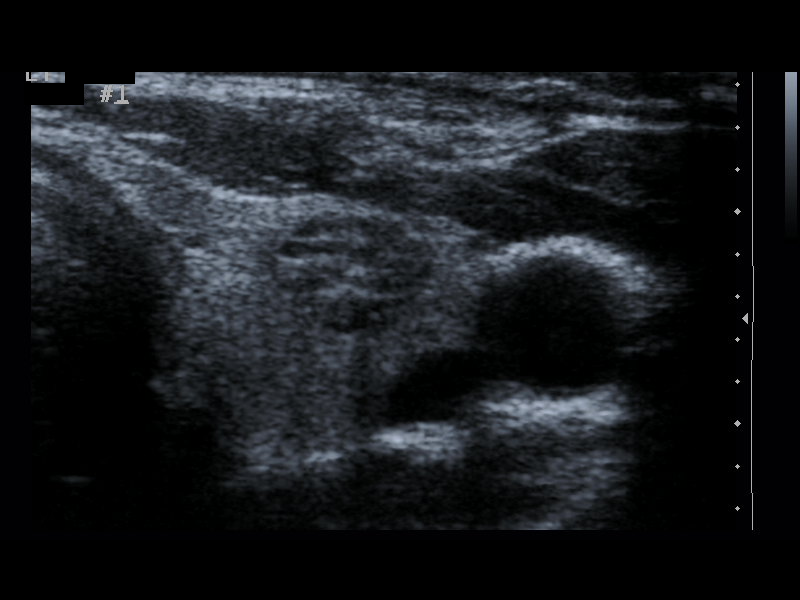
[im 5/7]
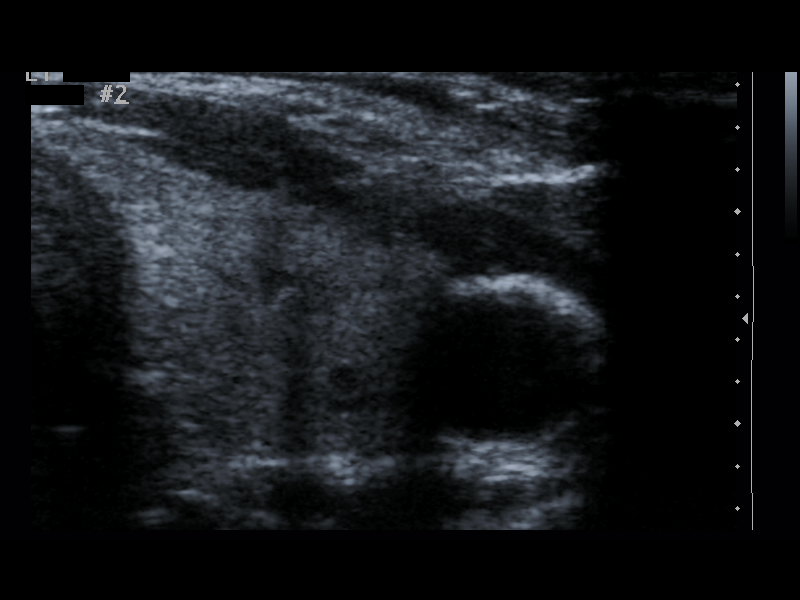
[im 6/7]
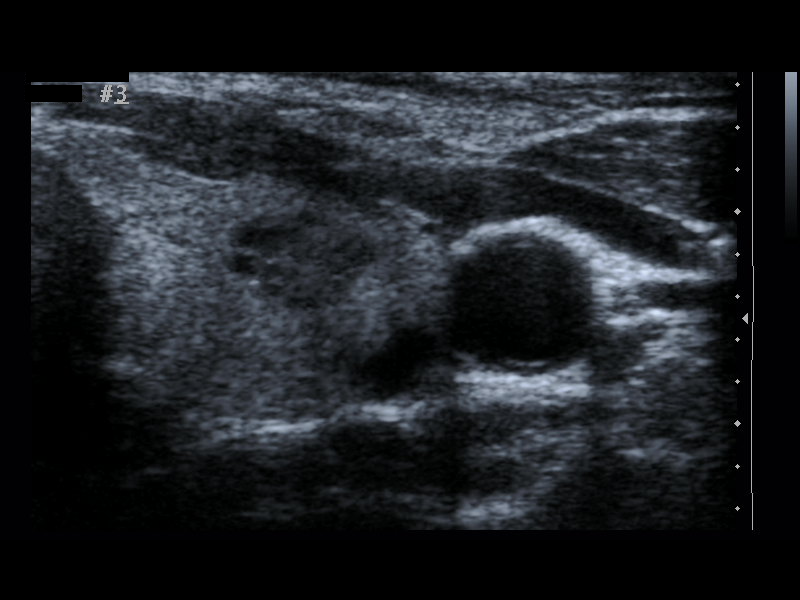
[im 7/7]
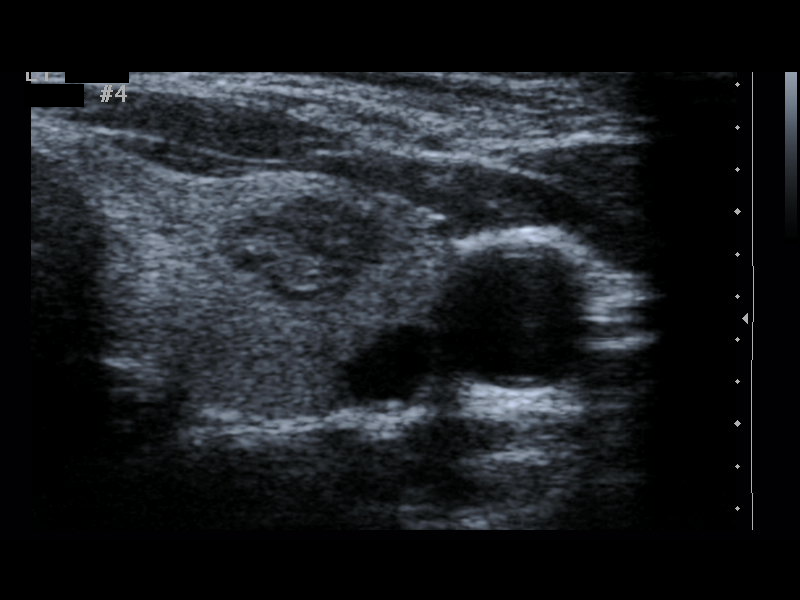

[7 of 7 positions shown; findings below may reference images not displayed]

Thyroid biopsy was thoroughly discussed with the patient and
questions were answered.  The benefits, risks, alternatives, and
complications were also discussed.  The patient understands and
wishes to proceed with the procedure.  Written consent was
obtained.

Ultrasound was performed to localize and mark an adequate site for
the biopsy.  The patient was then prepped and draped in a normal
sterile fashion.  Local anesthesia was provided with 1% lidocaine.
Using direct ultrasound guidance, 4 passes were made using 25 gauge
needles into the nodule within the left lobe of the thyroid.
Ultrasound was used to confirm needle placements on all occasions.
Specimens were sent to Pathology for analysis.

Complications:  None
FINDINGS: Needle aspirate samples were obtained in different
portions of the left thyroid nodule.  The more solid portion of the
partially solid and partially cystic nodule was targeted.
IMPRESSION: Ultrasound guided needle aspirate biopsy performed of the dominant
left thyroid nodule.

## 2014-08-29 ENCOUNTER — Other Ambulatory Visit: Payer: Self-pay | Admitting: Obstetrics & Gynecology

## 2014-08-30 LAB — CYTOLOGY - PAP

## 2014-09-15 ENCOUNTER — Other Ambulatory Visit: Payer: Self-pay | Admitting: Cardiology

## 2014-10-12 ENCOUNTER — Ambulatory Visit: Payer: BC Managed Care – PPO | Admitting: Cardiology

## 2014-10-12 ENCOUNTER — Other Ambulatory Visit (INDEPENDENT_AMBULATORY_CARE_PROVIDER_SITE_OTHER): Payer: BC Managed Care – PPO | Admitting: *Deleted

## 2014-10-12 DIAGNOSIS — E7800 Pure hypercholesterolemia, unspecified: Secondary | ICD-10-CM

## 2014-10-12 DIAGNOSIS — I10 Essential (primary) hypertension: Secondary | ICD-10-CM

## 2014-10-12 LAB — BASIC METABOLIC PANEL
BUN: 8 mg/dL (ref 6–23)
CALCIUM: 9.3 mg/dL (ref 8.4–10.5)
CO2: 26 mEq/L (ref 19–32)
Chloride: 107 mEq/L (ref 96–112)
Creatinine, Ser: 0.9 mg/dL (ref 0.4–1.2)
GFR: 69.93 mL/min (ref >60.00)
GLUCOSE: 92 mg/dL (ref 70–99)
Potassium: 4 mEq/L (ref 3.5–5.1)
SODIUM: 141 meq/L (ref 135–145)

## 2014-10-12 LAB — HEPATIC FUNCTION PANEL
ALK PHOS: 47 U/L (ref 39–117)
ALT: 18 U/L (ref 0–35)
AST: 24 U/L (ref 0–37)
Albumin: 3.4 g/dL — ABNORMAL LOW (ref 3.5–5.2)
Bilirubin, Direct: 0 mg/dL (ref 0.0–0.3)
Total Bilirubin: 0.6 mg/dL (ref 0.2–1.2)
Total Protein: 6.8 g/dL (ref 6.0–8.3)

## 2014-10-12 LAB — LIPID PANEL
Cholesterol: 164 mg/dL (ref 0–200)
HDL: 58.3 mg/dL (ref >39.00)
LDL Cholesterol: 96 mg/dL (ref 0–99)
NonHDL: 105.7
Total CHOL/HDL Ratio: 3
Triglycerides: 51 mg/dL (ref 0.0–149.0)
VLDL: 10.2 mg/dL (ref 0.0–40.0)

## 2014-10-12 NOTE — Progress Notes (Signed)
Quick Note:  Please make copy of labs for patient visit. ______ 

## 2014-10-26 ENCOUNTER — Encounter: Payer: Self-pay | Admitting: Cardiology

## 2014-10-26 ENCOUNTER — Ambulatory Visit (INDEPENDENT_AMBULATORY_CARE_PROVIDER_SITE_OTHER): Payer: BC Managed Care – PPO | Admitting: Cardiology

## 2014-10-26 VITALS — BP 112/80 | HR 71 | Ht 64.0 in | Wt 141.0 lb

## 2014-10-26 DIAGNOSIS — I252 Old myocardial infarction: Secondary | ICD-10-CM

## 2014-10-26 DIAGNOSIS — I10 Essential (primary) hypertension: Secondary | ICD-10-CM

## 2014-10-26 DIAGNOSIS — E038 Other specified hypothyroidism: Secondary | ICD-10-CM

## 2014-10-26 DIAGNOSIS — E7800 Pure hypercholesterolemia, unspecified: Secondary | ICD-10-CM

## 2014-10-26 DIAGNOSIS — R609 Edema, unspecified: Secondary | ICD-10-CM

## 2014-10-26 DIAGNOSIS — R600 Localized edema: Secondary | ICD-10-CM | POA: Insufficient documentation

## 2014-10-26 MED ORDER — AMLODIPINE BESYLATE 5 MG PO TABS: 2.5000 mg | ORAL_TABLET | Freq: Every day | ORAL | Status: DC

## 2014-10-26 NOTE — Progress Notes (Signed)
Lindsay [name] Date of Birth:  September 25, 1961 [facility] [address] [place], [place]  [address] [phone]        Fax   [phone]   History of Present Illness: This pleasant 53 year old woman is seen for a six-month followup office visit. She has a past history of hypercholesterolemia. At age 15 she had an acute myocardial infarction at that time that she was on birth control pills. She has an unusual prothrombin factor and has been evaluated by hematology at [facility]. They recommended long-term aspirin. She is also avoiding birth control pills and hormones. He said no subsequent problems with angina or myocardial infarction. She had a catheterization at the time of her myocardial infarction which showed normal coronary arteries and normal LV function with an ejection fraction of 70%. The patient has not been experiencing any exertional symptoms. Since last visit she has begun to have some symptoms of Raynaud's phenomena.  Her symptoms of Raynaud's have improved when we switched her from verapamil to amlodipine for her calcium channel blocker.  Since last visit she has been to a clinic in [place] where she was analyzed for food allergies.  She was found to be hypothyroid and found to be allergic to gluten and nuts and dairy products and eggs.  Initially when she went on her new diet she lost 7 pounds.  However since her last visit she has regained 7 pounds.  She has been having some problems with swelling of her feet which may be related to her amlodipine.  Current Outpatient Prescriptions  Medication Sig Dispense Refill  . amLODipine (NORVASC) 5 MG tablet Take 0.5 tablets (2.5 mg total) by mouth daily. 90 tablet 3  . Ascorbic Acid (VITAMIN C) 1000 MG tablet Take 1,000 mg by mouth daily.    . Biotin 5000 MCG TABS Take 10,000 mg by mouth.     . Calcium Carb-Cholecalciferol (CALCIUM 600 + D PO) Take by mouth.    . Calcium-Magnesium-Vitamin D (CALCIUM 500 PO)  Take by mouth.    . cetirizine (ZYRTEC) 10 MG tablet Take 10 mg by mouth daily.      . Cholecalciferol (VITAMIN D3) 2000 UNITS TABS Take by mouth.      . folic acid (FOLVITE) 502 MCG tablet Take 800 mcg by mouth daily.    . Garlic TABS Take 774 tablets by mouth daily. GARLIC COMPLEX takes 128 mg.    . Iodine (LUGOLS) SOLN Apply 2 % topically.    Javier Docker Oil 500 MG CAPS Take 1 capsule by mouth 2 (two) times daily.    Marland Kitchen MAGNESIUM CITRATE PO Take 400 Units by mouth daily.     . Misc Natural Products (DANDELION ROOT PO) Take 1.57 g by mouth.    Marland Kitchen OVER THE COUNTER MEDICATION Take 2 tablets by mouth daily. Women's 50 + one a day.    . Saw Palmetto 80 MG CAPS Take 450 mg by mouth.     . Thyroid (NATURE-THROID PO) Take 48.75 mg by mouth daily.     . vitamin E 400 UNIT capsule Take 400 Units by mouth daily.       No current facility-administered medications for this visit.    Allergies  Allergen Reactions  . Penicillins     rash  . Sulfa Antibiotics     Patient Active Problem List   Diagnosis Date Noted  . Hypothyroid 04/12/2014  . Raynaud phenomenon 10/13/2013  . Hemorrhoid 09/15/2013  . Routine  general medical examination at a health care facility 08/06/2013  . Sinusitis 07/21/2013  . Thyroid nodule 07/21/2013  . HTN (hypertension) 07/31/2012  . General medical examination 08/06/2011  . Clotting disorder 08/06/2011  . Depression 08/06/2011  . Headache 08/06/2011  . Osteoarthritis 08/06/2011  . History of heart attack   . Hypercholesterolemia 07/08/2011    History  Smoking status  . Never Smoker   Smokeless tobacco  . Never Used    History  Alcohol Use  . 1.0 - 1.5 oz/week  . 2-3 drink(s) per week    Comment: weekly    Family History  Problem Relation Age of Onset  . Cardiomyopathy Mother   . Stroke Mother   . Hypertension Mother   . Hyperlipidemia Father   . Heart disease Father   . Arthritis Paternal Grandmother   . Kidney disease Paternal Grandmother      Review of Systems: Constitutional: no fever chills diaphoresis or fatigue or change in weight.  Head and neck: no hearing loss, no epistaxis, no photophobia or visual disturbance. Respiratory: No cough, shortness of breath or wheezing. Cardiovascular: No chest pain peripheral edema, palpitations. Gastrointestinal: No abdominal distention, no abdominal pain, no change in bowel habits hematochezia or melena. Genitourinary: No dysuria, no frequency, no urgency, no nocturia. Musculoskeletal:No arthralgias, no back pain, no gait disturbance or myalgias. Neurological: No dizziness, no headaches, no numbness, no seizures, no syncope, no weakness, no tremors. Hematologic: No lymphadenopathy, no easy bruising. Psychiatric: No confusion, no hallucinations, no sleep disturbance.    Physical Exam: Filed Vitals:   10/26/14 1634  BP: 112/80  Pulse: 71   the general appearance reveals a well-developed well-nourished woman in no distress.The head and neck exam reveals pupils equal and reactive.  Extraocular movements are full.  There is no scleral icterus.  The mouth and pharynx are normal.  The neck is supple.  The carotids reveal no bruits.  The jugular venous pressure is normal.  The  thyroid is not enlarged.  There is no lymphadenopathy.  The chest is clear to percussion and auscultation.  There are no rales or rhonchi.  Expansion of the chest is symmetrical.  The precordium is quiet.  The first heart sound is normal.  The second heart sound is physiologically split.  There is no murmur gallop rub or click.  There is no abnormal lift or heave.  The abdomen is soft and nontender.  The bowel sounds are normal.  The liver and spleen are not enlarged.  There are no abdominal masses.  There are no abdominal bruits.  Extremities reveal good pedal pulses.  There is no phlebitis or edema.  There is no cyanosis or clubbing.  Strength is normal and symmetrical in all extremities.  There is no lateralizing  weakness.  There are no sensory deficits.  The skin is warm and dry.  There is no rash.  EKG shows normal sinus rhythm and is within normal limits   Assessment / Plan: 1. 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 2. Hypercholesterolemia 3. multiple food allergies 4. Hypothyroidism 5.  Raynaud's phenomena 6. Hypertension 7.  Peripheral edema  Continue same medication except decrease amlodipine to just 2.5 mg daily..  Recheck in 6 months for office visit and fasting lab work.

## 2014-10-26 NOTE — Assessment & Plan Note (Signed)
Her lipids remained satisfactory on current diet and exercise regimen

## 2014-10-26 NOTE — Assessment & Plan Note (Signed)
The patient is not having any exertional chest pain.  She continues to exercise.

## 2014-10-26 NOTE — Patient Instructions (Addendum)
DECREASE AMLODIPINE 5MG  TO 1/2 A TABLET BY MOUTH DAILY  Your physician wants you to follow-up in: 6 months with fasting labs (LP/HFP/BMET). You will receive a reminder letter in the mail two months in advance. If you don't receive a letter, please call our office to schedule the follow-up appointment.

## 2014-10-26 NOTE — Assessment & Plan Note (Signed)
Her blood pressure has been remaining stable on current therapy.  She however has been having edema in her feet by the end of the day.  This may be partly from amlodipine.  We will reduce her amlodipine to just half of a 5 mg tablet daily

## 2015-01-20 ENCOUNTER — Other Ambulatory Visit: Payer: Self-pay | Admitting: Dermatology

## 2015-02-13 ENCOUNTER — Other Ambulatory Visit: Payer: Self-pay

## 2015-02-14 LAB — CYTOLOGY - PAP

## 2015-04-24 ENCOUNTER — Ambulatory Visit (INDEPENDENT_AMBULATORY_CARE_PROVIDER_SITE_OTHER): Payer: BLUE CROSS/BLUE SHIELD | Admitting: Cardiology

## 2015-04-24 ENCOUNTER — Encounter: Payer: Self-pay | Admitting: Cardiology

## 2015-04-24 VITALS — BP 108/76 | HR 71 | Ht 64.0 in | Wt 140.0 lb

## 2015-04-24 DIAGNOSIS — E7800 Pure hypercholesterolemia, unspecified: Secondary | ICD-10-CM

## 2015-04-24 DIAGNOSIS — R600 Localized edema: Secondary | ICD-10-CM

## 2015-04-24 DIAGNOSIS — E038 Other specified hypothyroidism: Secondary | ICD-10-CM | POA: Diagnosis not present

## 2015-04-24 DIAGNOSIS — R609 Edema, unspecified: Secondary | ICD-10-CM | POA: Diagnosis not present

## 2015-04-24 DIAGNOSIS — I252 Old myocardial infarction: Secondary | ICD-10-CM | POA: Diagnosis not present

## 2015-04-24 DIAGNOSIS — I10 Essential (primary) hypertension: Secondary | ICD-10-CM

## 2015-04-24 LAB — HEPATIC FUNCTION PANEL
ALT: 21 U/L (ref 0–35)
AST: 22 U/L (ref 0–37)
Albumin: 4 g/dL (ref 3.5–5.2)
Alkaline Phosphatase: 46 U/L (ref 39–117)
Bilirubin, Direct: 0.2 mg/dL (ref 0.0–0.3)
Total Bilirubin: 0.7 mg/dL (ref 0.2–1.2)
Total Protein: 6.7 g/dL (ref 6.0–8.3)

## 2015-04-24 LAB — BASIC METABOLIC PANEL
BUN: 15 mg/dL (ref 6–23)
CHLORIDE: 106 meq/L (ref 96–112)
CO2: 29 mEq/L (ref 19–32)
Calcium: 9.4 mg/dL (ref 8.4–10.5)
Creatinine, Ser: 0.85 mg/dL (ref 0.40–1.20)
GFR: 73.6 mL/min (ref >60.00)
Glucose, Bld: 104 mg/dL — ABNORMAL HIGH (ref 70–99)
Potassium: 3.9 mEq/L (ref 3.5–5.1)
SODIUM: 139 meq/L (ref 135–145)

## 2015-04-24 LAB — LIPID PANEL
Cholesterol: 158 mg/dL (ref 0–200)
HDL: 63.5 mg/dL (ref >39.00)
LDL Cholesterol: 84 mg/dL (ref 0–99)
NonHDL: 94.5
Total CHOL/HDL Ratio: 2
Triglycerides: 55 mg/dL (ref 0.0–149.0)
VLDL: 11 mg/dL (ref 0.0–40.0)

## 2015-04-24 MED ORDER — AMLODIPINE BESYLATE 2.5 MG PO TABS: 2.5000 mg | ORAL_TABLET | Freq: Every day | ORAL | Status: AC

## 2015-04-24 NOTE — Patient Instructions (Signed)
Medication Instructions:  New Rx for the Amlodipine 2.5 mg one daily sent to pharmacy  Labwork: Lp/bmet/hpf  Testing/Procedures: none  Follow-Up: Your physician wants you to follow-up in: 6 months with fasting labs (lp/bmet/hfp)  You will receive a reminder letter in the mail two months in advance. If you don't receive a letter, please call our office to schedule the follow-up appointment.

## 2015-04-24 NOTE — Progress Notes (Signed)
Cardiology Office Note   Date:  04/24/2015   ID:  Lindsay, [name] 1961-04-02, MRN [id number]  PCP:  Nance Pear., NP  Cardiologist: Darlin Coco MD  No chief complaint on file.     History of Present Illness: Lindsay [name] is a 54 y.o. female who presents for a six-month follow-up visit.  This pleasant 54 year old woman is seen for a six-month followup office visit. She has a past history of hypercholesterolemia. At age 40 she had an acute myocardial infarction at that time that she was on birth control pills. She has an unusual prothrombin factor and has been evaluated by hematology at [facility]. They recommended long-term aspirin. She is also avoiding birth control pills and hormones. He said no subsequent problems with angina or myocardial infarction. She had a catheterization at the time of her myocardial infarction which showed normal coronary arteries and normal LV function with an ejection fraction of 70%. The patient has not been experiencing any exertional symptoms. Since last visit she has begun to have some symptoms of Raynaud's phenomena. Her symptoms of Raynaud's have improved when we switched her from verapamil to amlodipine for her calcium channel blocker. Since last visit she has been to a clinic in [place] where she was analyzed for food allergies. She was found to be hypothyroid and found to be allergic to gluten and nuts and dairy products and eggs.  [place]  They are building a new home on 6 acres of land.  Her husband is retiring from the police force. She and her husband have been busy with the move.  Normally they exercise on a regular basis. She reports that her peripheral edema has improved since we reduced her dose of amlodipine to just 2.5 mg daily  Past Medical History  Diagnosis Date  . Hyperlipidemia   . Acute myocardial infarction     normal coronary arteries  .  Hypertension   . Arthritis   . History of chicken pox   . Depression   . Allergy   . Migraines   . History of blood clots     Thought to be due to Ortho Evra patch  . History of heart attack 2003    thought to be due to hormone replacement therapy  . Arrhythmia   . History of colon polyps   . Osteopenia   . Basal cell carcinoma   . Clotting disorder 08/06/2011  . Osteoarthritis 08/06/2011    Past Surgical History  Procedure Laterality Date  . Nasal sinus surgery      Dr. Rock Nephew  . Cardiac catheterization  08/30/2002    Dr. Martinique  . Dilation and curettage of uterus  1978, 1998  . Bunionectomy Bilateral 2008 and 2012  . Rotator cuff repair  2010  . Fracture surgery  01/2005    left arm     Current Outpatient Prescriptions  Medication Sig Dispense Refill  . amLODipine (NORVASC) 2.5 MG tablet Take 1 tablet (2.5 mg total) by mouth daily. 90 tablet 3  . Ascorbic Acid (VITAMIN C) 1000 MG tablet Take 1,000 mg by mouth 2 (two) times daily.     . Biotin 5000 MCG TABS Take 10,000 mg by mouth.     . Calcium Carb-Cholecalciferol (CALCIUM 600 + D PO) Take by mouth.    . cetirizine (ZYRTEC) 10 MG tablet Take 10 mg by mouth daily.      Marland Kitchen  Cholecalciferol (VITAMIN D3) 2000 UNITS TABS Take 4,000 Units by mouth daily.     . Garlic TABS Take 2 tablets by mouth daily. GARLIC COMPLEX takes 376 mg.    . Iodine (LUGOLS) SOLN Apply 2 % topically. Add 5 drops to water and drink daily    . Krill Oil 500 MG CAPS Take 1 capsule by mouth 2 (two) times daily.    Marland Kitchen MAGNESIUM CITRATE PO Take 400 Units by mouth daily.     Marland Kitchen OVER THE COUNTER MEDICATION Take 2 tablets by mouth daily. Women's 50 + one a day.    . Thyroid (NATURE-THROID PO) Take 65 mg by mouth daily.     . Turmeric Curcumin 500 MG CAPS Take 1,000 mg by mouth daily.    Marland Kitchen VITAMIN A FISH PO Take 10,000 Units by mouth daily.    . vitamin B-12 (CYANOCOBALAMIN) 1000 MCG tablet Take 1,000 mcg by mouth daily.    . vitamin E 400 UNIT capsule Take 400  Units by mouth daily.       No current facility-administered medications for this visit.    Allergies:   Penicillins and Sulfa antibiotics    Social History:  The patient  reports that she has never smoked. She has never used smokeless tobacco. She reports that she drinks about 1.0 - 1.5 oz of alcohol per week. She reports that she does not use illicit drugs.   Family History:  The patient's family history includes Arthritis in her paternal grandmother; Cardiomyopathy in her mother; Heart disease in her father; Hyperlipidemia in her father; Hypertension in her mother; Kidney disease in her paternal grandmother; Stroke in her mother.    ROS:  Please see the history of present illness.   Otherwise, review of systems are positive for none.   All other systems are reviewed and negative.    PHYSICAL EXAM: VS:  BP 108/76 mmHg  Pulse 71  Ht 5\' 4"  (1.626 m)  Wt 140 lb (63.504 kg)  BMI 24.02 kg/m2 , BMI Body mass index is 24.02 kg/(m^2). GEN: Well nourished, well developed, in no acute distress HEENT: normal Neck: no JVD, carotid bruits, or masses Cardiac: RRR; no murmurs, rubs, or gallops,no edema  Respiratory:  clear to auscultation bilaterally, normal work of breathing GI: soft, nontender, nondistended, + BS MS: no deformity or atrophy Skin: warm and dry, no rash Neuro:  Strength and sensation are intact Psych: euthymic mood, full affect   EKG:  EKG is ordered today.    Recent Labs: 10/12/2014: ALT 18; BUN 8; Creatinine 0.9; Potassium 4.0; Sodium 141    Lipid Panel    Component Value Date/Time   CHOL 164 10/12/2014 0847   TRIG 51.0 10/12/2014 0847   HDL 58.30 10/12/2014 0847   CHOLHDL 3 10/12/2014 0847   VLDL 10.2 10/12/2014 0847   LDLCALC 96 10/12/2014 0847   LDLDIRECT 125.7 10/13/2013 1001      Wt Readings from Last 3 Encounters:  04/24/15 140 lb (63.504 kg)  10/26/14 141 lb (63.957 kg)  04/12/14 134 lb (60.782 kg)         ASSESSMENT AND PLAN:  1.  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 2. Hypercholesterolemia 3. multiple food allergies 4. Hypothyroidism 5. Raynaud's phenomena 6. Hypertension 7. Peripheral edema, improved on lower dose of amlodipine  Continue current medication.  Recheck in 6 months for office visit lipid panel hepatic function panel and basal metabolic panel  Current medicines are  reviewed at length with the patient today.  The patient does not have concerns regarding medicines.  The following changes have been made:  no change  Labs/ tests ordered today include:   Orders Placed This Encounter  Procedures  . Lipid panel  . Hepatic function panel  . Basic metabolic panel  . EKG 12-Lead      SignedDarlin Coco MD 04/24/2015 9:33 AM    [address], [place], [place]  [address] Phone: [phone]; Fax: [phone]

## 2015-04-25 NOTE — Progress Notes (Signed)
Quick Note:  Please report to patient. The recent labs are stable. Continue same medication and careful diet. ______ 

## 2015-09-26 ENCOUNTER — Telehealth: Payer: Self-pay | Admitting: Cardiology

## 2015-09-26 NOTE — Telephone Encounter (Signed)
Will forward to  Dr. Brackbill for review 

## 2015-09-26 NOTE — Telephone Encounter (Signed)
New Message  Pt calling to see if Dr Mare Ferrari could recommend cardiologist in [facility]- pt has recently moved to [facility]. Please call back and discuss.

## 2015-09-27 NOTE — Telephone Encounter (Signed)
I would suggest Dr. Gregary Signs at [facility].

## 2015-09-28 NOTE — Telephone Encounter (Signed)
Advised patient
